# Patient Record
Sex: Male | Born: 1940 | Race: White | Hispanic: No | Marital: Single | Smoking: Never smoker
Health system: Southern US, Community
[De-identification: ages and names within clinical notes are randomized; demographics above are authoritative.]

## PROBLEM LIST (undated history)

## (undated) DIAGNOSIS — R413 Other amnesia: Secondary | ICD-10-CM

## (undated) DIAGNOSIS — I10 Essential (primary) hypertension: Secondary | ICD-10-CM

## (undated) DIAGNOSIS — R739 Hyperglycemia, unspecified: Secondary | ICD-10-CM

## (undated) DIAGNOSIS — E785 Hyperlipidemia, unspecified: Secondary | ICD-10-CM

## (undated) DIAGNOSIS — H539 Unspecified visual disturbance: Secondary | ICD-10-CM

## (undated) DIAGNOSIS — N400 Enlarged prostate without lower urinary tract symptoms: Secondary | ICD-10-CM

## (undated) DIAGNOSIS — E538 Deficiency of other specified B group vitamins: Secondary | ICD-10-CM

## (undated) DIAGNOSIS — D329 Benign neoplasm of meninges, unspecified: Secondary | ICD-10-CM

## (undated) DIAGNOSIS — R569 Unspecified convulsions: Secondary | ICD-10-CM

## (undated) DIAGNOSIS — F329 Major depressive disorder, single episode, unspecified: Secondary | ICD-10-CM

## (undated) DIAGNOSIS — Z9889 Other specified postprocedural states: Secondary | ICD-10-CM

## (undated) DIAGNOSIS — E559 Vitamin D deficiency, unspecified: Secondary | ICD-10-CM

## (undated) DIAGNOSIS — F32A Depression, unspecified: Secondary | ICD-10-CM

## (undated) DIAGNOSIS — N183 Chronic kidney disease, stage 3 unspecified: Secondary | ICD-10-CM

## (undated) DIAGNOSIS — I4891 Unspecified atrial fibrillation: Secondary | ICD-10-CM

## (undated) DIAGNOSIS — G250 Essential tremor: Secondary | ICD-10-CM

## (undated) DIAGNOSIS — R1310 Dysphagia, unspecified: Secondary | ICD-10-CM

## (undated) DIAGNOSIS — R7303 Prediabetes: Secondary | ICD-10-CM

## (undated) HISTORY — DX: Other specified postprocedural states: Z98.890

## (undated) HISTORY — DX: Essential tremor: G25.0

## (undated) HISTORY — DX: Benign prostatic hyperplasia without lower urinary tract symptoms: N40.0

## (undated) HISTORY — DX: Unspecified convulsions: R56.9

## (undated) HISTORY — DX: Unspecified visual disturbance: H53.9

## (undated) HISTORY — DX: Hyperlipidemia, unspecified: E78.5

## (undated) HISTORY — DX: Deficiency of other specified B group vitamins: E53.8

## (undated) HISTORY — DX: Benign neoplasm of meninges, unspecified: D32.9

## (undated) HISTORY — DX: Depression, unspecified: F32.A

## (undated) HISTORY — DX: Major depressive disorder, single episode, unspecified: F32.9

## (undated) HISTORY — DX: Unspecified atrial fibrillation: I48.91

## (undated) HISTORY — PX: APPENDECTOMY: SHX54

## (undated) HISTORY — DX: Essential (primary) hypertension: I10

## (undated) HISTORY — DX: Hyperglycemia, unspecified: R73.9

## (undated) HISTORY — DX: Dysphagia, unspecified: R13.10

## (undated) HISTORY — DX: Prediabetes: R73.03

## (undated) HISTORY — DX: Vitamin D deficiency, unspecified: E55.9

## (undated) HISTORY — DX: Other amnesia: R41.3

## (undated) HISTORY — PX: CRANIOTOMY: SHX93

## (undated) HISTORY — DX: Chronic kidney disease, stage 3 unspecified: N18.30

---

## 2001-10-30 ENCOUNTER — Encounter: Admission: RE | Admit: 2001-10-30 | Discharge: 2001-10-30 | Payer: Self-pay | Admitting: Oncology

## 2001-10-30 ENCOUNTER — Encounter (HOSPITAL_COMMUNITY): Payer: Self-pay | Admitting: Oncology

## 2001-10-30 ENCOUNTER — Encounter (HOSPITAL_COMMUNITY): Admission: RE | Admit: 2001-10-30 | Discharge: 2001-11-29 | Payer: Self-pay | Admitting: Oncology

## 2002-10-30 ENCOUNTER — Encounter (HOSPITAL_COMMUNITY): Payer: Self-pay | Admitting: Oncology

## 2002-10-30 ENCOUNTER — Encounter: Admission: RE | Admit: 2002-10-30 | Discharge: 2002-10-30 | Payer: Self-pay | Admitting: Oncology

## 2002-10-30 ENCOUNTER — Encounter (HOSPITAL_COMMUNITY): Admission: RE | Admit: 2002-10-30 | Discharge: 2002-11-29 | Payer: Self-pay | Admitting: Oncology

## 2003-11-05 ENCOUNTER — Encounter: Admission: RE | Admit: 2003-11-05 | Discharge: 2003-11-05 | Payer: Self-pay | Admitting: Oncology

## 2003-11-05 ENCOUNTER — Encounter (HOSPITAL_COMMUNITY): Admission: RE | Admit: 2003-11-05 | Discharge: 2003-12-05 | Payer: Self-pay | Admitting: Oncology

## 2004-07-08 ENCOUNTER — Encounter: Admission: RE | Admit: 2004-07-08 | Discharge: 2004-07-08 | Payer: Self-pay | Admitting: Oncology

## 2004-11-03 ENCOUNTER — Ambulatory Visit (HOSPITAL_COMMUNITY): Payer: Self-pay | Admitting: Oncology

## 2004-11-03 ENCOUNTER — Encounter: Admission: RE | Admit: 2004-11-03 | Discharge: 2004-11-03 | Payer: Self-pay | Admitting: Oncology

## 2005-11-01 ENCOUNTER — Encounter (HOSPITAL_COMMUNITY): Admission: RE | Admit: 2005-11-01 | Discharge: 2005-12-01 | Payer: Self-pay | Admitting: Oncology

## 2005-11-01 ENCOUNTER — Encounter: Admission: RE | Admit: 2005-11-01 | Discharge: 2005-11-01 | Payer: Self-pay | Admitting: Oncology

## 2005-11-01 ENCOUNTER — Ambulatory Visit (HOSPITAL_COMMUNITY): Payer: Self-pay | Admitting: Oncology

## 2006-10-31 ENCOUNTER — Encounter (HOSPITAL_COMMUNITY): Admission: RE | Admit: 2006-10-31 | Discharge: 2006-11-30 | Payer: Self-pay | Admitting: Oncology

## 2006-10-31 ENCOUNTER — Ambulatory Visit (HOSPITAL_COMMUNITY): Payer: Self-pay | Admitting: Oncology

## 2007-11-13 ENCOUNTER — Encounter (HOSPITAL_COMMUNITY): Admission: RE | Admit: 2007-11-13 | Discharge: 2007-12-13 | Payer: Self-pay | Admitting: Oncology

## 2007-11-13 ENCOUNTER — Ambulatory Visit (HOSPITAL_COMMUNITY): Payer: Self-pay | Admitting: Oncology

## 2009-04-15 ENCOUNTER — Encounter: Admission: RE | Admit: 2009-04-15 | Discharge: 2009-04-15 | Payer: Self-pay | Admitting: Neurology

## 2009-04-24 ENCOUNTER — Encounter: Admission: RE | Admit: 2009-04-24 | Discharge: 2009-04-24 | Payer: Self-pay | Admitting: Neurology

## 2009-04-29 ENCOUNTER — Ambulatory Visit: Payer: Self-pay | Admitting: Cardiology

## 2009-04-29 ENCOUNTER — Inpatient Hospital Stay (HOSPITAL_COMMUNITY): Admission: EM | Admit: 2009-04-29 | Discharge: 2009-05-07 | Payer: Self-pay | Admitting: Neurosurgery

## 2009-04-30 ENCOUNTER — Encounter (INDEPENDENT_AMBULATORY_CARE_PROVIDER_SITE_OTHER): Payer: Self-pay | Admitting: Ophthalmology

## 2009-05-01 ENCOUNTER — Encounter (INDEPENDENT_AMBULATORY_CARE_PROVIDER_SITE_OTHER): Payer: Self-pay | Admitting: Neurosurgery

## 2009-05-04 ENCOUNTER — Ambulatory Visit: Payer: Self-pay | Admitting: Physical Medicine & Rehabilitation

## 2009-05-07 ENCOUNTER — Inpatient Hospital Stay (HOSPITAL_COMMUNITY)
Admission: RE | Admit: 2009-05-07 | Discharge: 2009-06-05 | Payer: Self-pay | Admitting: Physical Medicine & Rehabilitation

## 2009-05-16 ENCOUNTER — Ambulatory Visit: Payer: Self-pay | Admitting: Physical Medicine & Rehabilitation

## 2009-07-20 ENCOUNTER — Encounter
Admission: RE | Admit: 2009-07-20 | Discharge: 2009-07-20 | Payer: Self-pay | Admitting: Physical Medicine & Rehabilitation

## 2009-07-20 ENCOUNTER — Ambulatory Visit: Payer: Self-pay | Admitting: Physical Medicine & Rehabilitation

## 2010-11-08 ENCOUNTER — Encounter: Payer: Self-pay | Admitting: Physical Medicine & Rehabilitation

## 2011-01-22 LAB — GLUCOSE, CAPILLARY
Glucose-Capillary: 101 mg/dL — ABNORMAL HIGH (ref 70–99)
Glucose-Capillary: 102 mg/dL — ABNORMAL HIGH (ref 70–99)
Glucose-Capillary: 105 mg/dL — ABNORMAL HIGH (ref 70–99)
Glucose-Capillary: 108 mg/dL — ABNORMAL HIGH (ref 70–99)
Glucose-Capillary: 109 mg/dL — ABNORMAL HIGH (ref 70–99)
Glucose-Capillary: 110 mg/dL — ABNORMAL HIGH (ref 70–99)
Glucose-Capillary: 110 mg/dL — ABNORMAL HIGH (ref 70–99)
Glucose-Capillary: 111 mg/dL — ABNORMAL HIGH (ref 70–99)
Glucose-Capillary: 115 mg/dL — ABNORMAL HIGH (ref 70–99)
Glucose-Capillary: 115 mg/dL — ABNORMAL HIGH (ref 70–99)
Glucose-Capillary: 117 mg/dL — ABNORMAL HIGH (ref 70–99)
Glucose-Capillary: 122 mg/dL — ABNORMAL HIGH (ref 70–99)
Glucose-Capillary: 122 mg/dL — ABNORMAL HIGH (ref 70–99)
Glucose-Capillary: 122 mg/dL — ABNORMAL HIGH (ref 70–99)
Glucose-Capillary: 129 mg/dL — ABNORMAL HIGH (ref 70–99)
Glucose-Capillary: 145 mg/dL — ABNORMAL HIGH (ref 70–99)
Glucose-Capillary: 155 mg/dL — ABNORMAL HIGH (ref 70–99)
Glucose-Capillary: 159 mg/dL — ABNORMAL HIGH (ref 70–99)
Glucose-Capillary: 160 mg/dL — ABNORMAL HIGH (ref 70–99)
Glucose-Capillary: 165 mg/dL — ABNORMAL HIGH (ref 70–99)
Glucose-Capillary: 168 mg/dL — ABNORMAL HIGH (ref 70–99)
Glucose-Capillary: 174 mg/dL — ABNORMAL HIGH (ref 70–99)
Glucose-Capillary: 183 mg/dL — ABNORMAL HIGH (ref 70–99)
Glucose-Capillary: 183 mg/dL — ABNORMAL HIGH (ref 70–99)
Glucose-Capillary: 184 mg/dL — ABNORMAL HIGH (ref 70–99)
Glucose-Capillary: 82 mg/dL (ref 70–99)
Glucose-Capillary: 87 mg/dL (ref 70–99)
Glucose-Capillary: 88 mg/dL (ref 70–99)
Glucose-Capillary: 94 mg/dL (ref 70–99)
Glucose-Capillary: 97 mg/dL (ref 70–99)
Glucose-Capillary: 99 mg/dL (ref 70–99)

## 2011-01-22 LAB — BLOOD GAS, ARTERIAL
Drawn by: 281201
O2 Content: 2 L/min
O2 Saturation: 99.2 %
pCO2 arterial: 36.6 mmHg (ref 35.0–45.0)
pH, Arterial: 7.485 — ABNORMAL HIGH (ref 7.350–7.450)
pO2, Arterial: 127 mmHg — ABNORMAL HIGH (ref 80.0–100.0)

## 2011-01-22 LAB — BASIC METABOLIC PANEL
CO2: 28 mEq/L (ref 19–32)
CO2: 29 mEq/L (ref 19–32)
Chloride: 97 mEq/L (ref 96–112)
Chloride: 99 mEq/L (ref 96–112)
Creatinine, Ser: 0.94 mg/dL (ref 0.4–1.5)
GFR calc Af Amer: >60 mL/min (ref >60)
GFR calc Af Amer: >60 mL/min (ref >60)
Glucose, Bld: 128 mg/dL — ABNORMAL HIGH (ref 70–99)
Potassium: 3.7 mEq/L (ref 3.5–5.1)
Sodium: 131 mEq/L — ABNORMAL LOW (ref 135–145)
Sodium: 133 mEq/L — ABNORMAL LOW (ref 135–145)

## 2011-01-22 LAB — URINALYSIS, MICROSCOPIC ONLY
Bilirubin Urine: NEGATIVE
Glucose, UA: NEGATIVE mg/dL
Ketones, ur: NEGATIVE mg/dL
Nitrite: NEGATIVE
Specific Gravity, Urine: 1.012 (ref 1.005–1.030)
pH: 7 (ref 5.0–8.0)

## 2011-01-22 LAB — CBC
HCT: 37.3 % — ABNORMAL LOW (ref 39.0–52.0)
Hemoglobin: 11.9 g/dL — ABNORMAL LOW (ref 13.0–17.0)
Hemoglobin: 12.9 g/dL — ABNORMAL LOW (ref 13.0–17.0)
MCHC: 34.5 g/dL (ref 30.0–36.0)
MCV: 97 fL (ref 78.0–100.0)
RBC: 3.59 MIL/uL — ABNORMAL LOW (ref 4.22–5.81)
RBC: 3.85 MIL/uL — ABNORMAL LOW (ref 4.22–5.81)
RDW: 13.8 % (ref 11.5–15.5)
WBC: 9.3 10*3/uL (ref 4.0–10.5)

## 2011-01-22 LAB — URINE CULTURE
Colony Count: >=100000
Colony Count: NO GROWTH
Culture: NO GROWTH

## 2011-01-22 LAB — URINALYSIS, ROUTINE W REFLEX MICROSCOPIC
Bilirubin Urine: NEGATIVE
Glucose, UA: NEGATIVE mg/dL
Nitrite: NEGATIVE
Nitrite: NEGATIVE
Specific Gravity, Urine: 1.028 (ref 1.005–1.030)
pH: 5.5 (ref 5.0–8.0)
pH: 7 (ref 5.0–8.0)

## 2011-01-22 LAB — URINE MICROSCOPIC-ADD ON

## 2011-01-22 LAB — DIFFERENTIAL
Basophils Absolute: 0 10*3/uL (ref 0.0–0.1)
Lymphocytes Relative: 13 % (ref 12–46)
Lymphs Abs: 1.2 10*3/uL (ref 0.7–4.0)
Monocytes Absolute: 0.5 10*3/uL (ref 0.1–1.0)
Neutro Abs: 7.6 10*3/uL (ref 1.7–7.7)

## 2011-01-22 LAB — LEVETIRACETAM LEVEL: Levetiracetam Lvl: 13.7 ug/mL

## 2011-01-23 LAB — PREALBUMIN: Prealbumin: 28.9 mg/dL (ref 18.0–45.0)

## 2011-01-23 LAB — POCT I-STAT 4, (NA,K, GLUC, HGB,HCT): Sodium: 135 mEq/L (ref 135–145)

## 2011-01-23 LAB — BASIC METABOLIC PANEL
CO2: 26 mEq/L (ref 19–32)
CO2: 28 mEq/L (ref 19–32)
CO2: 29 mEq/L (ref 19–32)
Calcium: 8 mg/dL — ABNORMAL LOW (ref 8.4–10.5)
Calcium: 8 mg/dL — ABNORMAL LOW (ref 8.4–10.5)
Calcium: 8.4 mg/dL (ref 8.4–10.5)
Chloride: 99 mEq/L (ref 96–112)
Creatinine, Ser: 0.8 mg/dL (ref 0.4–1.5)
Creatinine, Ser: 1 mg/dL (ref 0.4–1.5)
GFR calc Af Amer: >60 mL/min (ref >60)
GFR calc Af Amer: >60 mL/min (ref >60)
GFR calc Af Amer: >60 mL/min (ref >60)
GFR calc Af Amer: >60 mL/min (ref >60)
GFR calc non Af Amer: >60 mL/min (ref >60)
GFR calc non Af Amer: >60 mL/min (ref >60)
GFR calc non Af Amer: >60 mL/min (ref >60)
Glucose, Bld: 140 mg/dL — ABNORMAL HIGH (ref 70–99)
Glucose, Bld: 166 mg/dL — ABNORMAL HIGH (ref 70–99)
Glucose, Bld: 265 mg/dL — ABNORMAL HIGH (ref 70–99)
Potassium: 4.2 mEq/L (ref 3.5–5.1)
Sodium: 132 mEq/L — ABNORMAL LOW (ref 135–145)
Sodium: 134 mEq/L — ABNORMAL LOW (ref 135–145)
Sodium: 134 mEq/L — ABNORMAL LOW (ref 135–145)
Sodium: 137 mEq/L (ref 135–145)

## 2011-01-23 LAB — URINALYSIS, ROUTINE W REFLEX MICROSCOPIC
Leukocytes, UA: NEGATIVE
Protein, ur: NEGATIVE mg/dL
Specific Gravity, Urine: 1.028 (ref 1.005–1.030)
Urobilinogen, UA: 4 mg/dL — ABNORMAL HIGH (ref 0.0–1.0)

## 2011-01-23 LAB — CBC
HCT: 32.7 % — ABNORMAL LOW (ref 39.0–52.0)
HCT: 36.5 % — ABNORMAL LOW (ref 39.0–52.0)
HCT: 39.6 % (ref 39.0–52.0)
Hemoglobin: 11.3 g/dL — ABNORMAL LOW (ref 13.0–17.0)
Hemoglobin: 12.5 g/dL — ABNORMAL LOW (ref 13.0–17.0)
Hemoglobin: 12.5 g/dL — ABNORMAL LOW (ref 13.0–17.0)
Hemoglobin: 12.8 g/dL — ABNORMAL LOW (ref 13.0–17.0)
Hemoglobin: 13.5 g/dL (ref 13.0–17.0)
Hemoglobin: 13.8 g/dL (ref 13.0–17.0)
MCHC: 33.7 g/dL (ref 30.0–36.0)
MCHC: 34.1 g/dL (ref 30.0–36.0)
MCHC: 34.5 g/dL (ref 30.0–36.0)
MCHC: 34.5 g/dL (ref 30.0–36.0)
MCV: 95.8 fL (ref 78.0–100.0)
MCV: 96.6 fL (ref 78.0–100.0)
MCV: 98 fL (ref 78.0–100.0)
MCV: 98.2 fL (ref 78.0–100.0)
Platelets: 169 10*3/uL (ref 150–400)
Platelets: 236 10*3/uL (ref 150–400)
RBC: 3.33 MIL/uL — ABNORMAL LOW (ref 4.22–5.81)
RBC: 3.67 MIL/uL — ABNORMAL LOW (ref 4.22–5.81)
RBC: 3.76 MIL/uL — ABNORMAL LOW (ref 4.22–5.81)
RBC: 3.78 MIL/uL — ABNORMAL LOW (ref 4.22–5.81)
RBC: 3.83 MIL/uL — ABNORMAL LOW (ref 4.22–5.81)
RBC: 3.83 MIL/uL — ABNORMAL LOW (ref 4.22–5.81)
RBC: 4.09 MIL/uL — ABNORMAL LOW (ref 4.22–5.81)
RBC: 4.13 MIL/uL — ABNORMAL LOW (ref 4.22–5.81)
RDW: 12.9 % (ref 11.5–15.5)
RDW: 13.3 % (ref 11.5–15.5)
RDW: 13.5 % (ref 11.5–15.5)
WBC: 12.5 10*3/uL — ABNORMAL HIGH (ref 4.0–10.5)
WBC: 24.7 10*3/uL — ABNORMAL HIGH (ref 4.0–10.5)
WBC: 51.8 10*3/uL (ref 4.0–10.5)

## 2011-01-23 LAB — GLUCOSE, CAPILLARY
Glucose-Capillary: 158 mg/dL — ABNORMAL HIGH (ref 70–99)
Glucose-Capillary: 161 mg/dL — ABNORMAL HIGH (ref 70–99)
Glucose-Capillary: 167 mg/dL — ABNORMAL HIGH (ref 70–99)
Glucose-Capillary: 168 mg/dL — ABNORMAL HIGH (ref 70–99)
Glucose-Capillary: 175 mg/dL — ABNORMAL HIGH (ref 70–99)
Glucose-Capillary: 176 mg/dL — ABNORMAL HIGH (ref 70–99)
Glucose-Capillary: 195 mg/dL — ABNORMAL HIGH (ref 70–99)
Glucose-Capillary: 198 mg/dL — ABNORMAL HIGH (ref 70–99)
Glucose-Capillary: 209 mg/dL — ABNORMAL HIGH (ref 70–99)
Glucose-Capillary: 212 mg/dL — ABNORMAL HIGH (ref 70–99)
Glucose-Capillary: 223 mg/dL — ABNORMAL HIGH (ref 70–99)
Glucose-Capillary: 225 mg/dL — ABNORMAL HIGH (ref 70–99)

## 2011-01-23 LAB — DIFFERENTIAL
Basophils Absolute: 0 10*3/uL (ref 0.0–0.1)
Basophils Relative: 0 % (ref 0–1)
Eosinophils Absolute: 0 10*3/uL (ref 0.0–0.7)
Eosinophils Absolute: 0 10*3/uL (ref 0.0–0.7)
Eosinophils Relative: 0 % (ref 0–5)
Lymphs Abs: 0.4 10*3/uL — ABNORMAL LOW (ref 0.7–4.0)
Lymphs Abs: 0.8 10*3/uL (ref 0.7–4.0)
Monocytes Absolute: 0.8 10*3/uL (ref 0.1–1.0)
Monocytes Absolute: 1.1 10*3/uL — ABNORMAL HIGH (ref 0.1–1.0)
Monocytes Relative: 4 % (ref 3–12)
Neutro Abs: 19.6 10*3/uL — ABNORMAL HIGH (ref 1.7–7.7)
Neutro Abs: 34 10*3/uL — ABNORMAL HIGH (ref 1.7–7.7)
Neutrophils Relative %: 96 % — ABNORMAL HIGH (ref 43–77)

## 2011-01-23 LAB — COMPREHENSIVE METABOLIC PANEL
AST: 30 U/L (ref 0–37)
AST: 44 U/L — ABNORMAL HIGH (ref 0–37)
Albumin: 1.9 g/dL — ABNORMAL LOW (ref 3.5–5.2)
Alkaline Phosphatase: 56 U/L (ref 39–117)
BUN: 25 mg/dL — ABNORMAL HIGH (ref 6–23)
CO2: 25 mEq/L (ref 19–32)
CO2: 26 mEq/L (ref 19–32)
Calcium: 8 mg/dL — ABNORMAL LOW (ref 8.4–10.5)
Chloride: 105 mEq/L (ref 96–112)
Chloride: 99 mEq/L (ref 96–112)
Creatinine, Ser: 0.95 mg/dL (ref 0.4–1.5)
Creatinine, Ser: 1.04 mg/dL (ref 0.4–1.5)
GFR calc Af Amer: >60 mL/min (ref >60)
GFR calc Af Amer: >60 mL/min (ref >60)
GFR calc non Af Amer: >60 mL/min (ref >60)
GFR calc non Af Amer: >60 mL/min (ref >60)
Glucose, Bld: 189 mg/dL — ABNORMAL HIGH (ref 70–99)
Potassium: 4 mEq/L (ref 3.5–5.1)
Potassium: 4.4 mEq/L (ref 3.5–5.1)
Sodium: 134 mEq/L — ABNORMAL LOW (ref 135–145)
Total Bilirubin: 0.9 mg/dL (ref 0.3–1.2)
Total Bilirubin: 1 mg/dL (ref 0.3–1.2)

## 2011-01-23 LAB — CULTURE, BLOOD (SINGLE)

## 2011-01-23 LAB — URINE CULTURE

## 2011-01-23 LAB — URINE MICROSCOPIC-ADD ON

## 2011-01-23 LAB — AMMONIA: Ammonia: 23 umol/L (ref 11–35)

## 2011-01-24 LAB — CROSSMATCH
ABO/RH(D): A POS
Antibody Screen: NEGATIVE

## 2011-01-24 LAB — URINALYSIS, ROUTINE W REFLEX MICROSCOPIC
Bilirubin Urine: NEGATIVE
Ketones, ur: NEGATIVE mg/dL
Nitrite: NEGATIVE
Protein, ur: NEGATIVE mg/dL

## 2011-01-24 LAB — CBC
HCT: 43.8 % (ref 39.0–52.0)
Hemoglobin: 15 g/dL (ref 13.0–17.0)
MCHC: 34.2 g/dL (ref 30.0–36.0)
MCV: 97.8 fL (ref 78.0–100.0)
Platelets: 276 10*3/uL (ref 150–400)
Platelets: 328 10*3/uL (ref 150–400)
RDW: 13 % (ref 11.5–15.5)
WBC: 15.1 10*3/uL — ABNORMAL HIGH (ref 4.0–10.5)

## 2011-01-24 LAB — COMPREHENSIVE METABOLIC PANEL
Albumin: 3.6 g/dL (ref 3.5–5.2)
Alkaline Phosphatase: 58 U/L (ref 39–117)
BUN: 16 mg/dL (ref 6–23)
Calcium: 9.5 mg/dL (ref 8.4–10.5)
Glucose, Bld: 189 mg/dL — ABNORMAL HIGH (ref 70–99)
Potassium: 4.3 mEq/L (ref 3.5–5.1)
Sodium: 134 mEq/L — ABNORMAL LOW (ref 135–145)
Total Protein: 6.7 g/dL (ref 6.0–8.3)

## 2011-01-24 LAB — ABO/RH: ABO/RH(D): A POS

## 2011-01-24 LAB — BASIC METABOLIC PANEL
BUN: 19 mg/dL (ref 6–23)
Calcium: 8.4 mg/dL (ref 8.4–10.5)
Chloride: 106 mEq/L (ref 96–112)
Creatinine, Ser: 0.89 mg/dL (ref 0.4–1.5)
GFR calc Af Amer: >60 mL/min (ref >60)

## 2011-01-24 LAB — PROTIME-INR
INR: 1.1 (ref 0.00–1.49)
Prothrombin Time: 15 seconds (ref 11.6–15.2)

## 2011-03-01 NOTE — Procedures (Signed)
EEG NUMBER:  [id number] [phone] [id number].   The patient is a left-handed gentleman, who is described as awake and  drowsy during this procedure of a portable EEG recording.  He was not  exposed to hyperventilation nor photic stimulation.   His current medications include Inderal, Protonix, Keppra, Decadron,  Tylenol, and Phenergan.  The patient is on tube feeding.   This is a repeat EEG on a 70 year old male post surgical removal of 2  meningeal tumors on the left side.  The craniotomy encompassed the  temporal and high parietal region on the left.  The patient had seizure  activity following his surgery.  This EEG is to evaluate for ongoing  subclinical seizure activity.   A posterior dominant background rhythm is seen at 7 Hz and constitutes a  slow background.  Throughout the right hemisphere, there appears to be  EMG artifact predominantly slow is the left hemisphere at the central  parietal region, especially C3 and P3.  Phase reversal activity with  sharp wave discharges is noted over both C3 and P3 electrodes, but not  over the temporal channel.  EKG showed a slow heart rhythm of about 55  beats per minute is very irregular R to R intervals and skipped beats at  times.  Also, the information is not available.  The patient may have  atrial fibrillation or heart block.   CONCLUSION:  This is an abnormal EEG:  1. Because it is generalized slow.  2. Because there is seizure activity or irritable foci seen over C3      and P3.  The patient did not show any continuous epileptiform      discharges that would constitute clinical seizure activity or      subclinical seizure activity, but a irritable focus is definitely      noted.  These epileptiform discharges were not periodic or      rhythmic.      Melvyn Novas, M.D.  Electronically Signed     NF:AOZH  D:  05/08/2009 20:16:00  T:  05/09/2009 04:00:08  Job #:  [id number]   cc:   Ranelle Oyster, M.D.  Fax: [phone]

## 2011-03-01 NOTE — Discharge Summary (Signed)
NAME:  David Casey, David Casey NO.:  [id number]   MEDICAL RECORD NO.:  [id number]          PATIENT TYPE:  IPS   LOCATION:  4006                         FACILITY:  [facility]   PHYSICIAN:  Ranelle Oyster, M.D.DATE OF BIRTH:  [birth year]   DATE OF ADMISSION:  05/07/2009  DATE OF DISCHARGE:                               DISCHARGE SUMMARY   DISCHARGE DIAGNOSES:  1. Left frontal meningioma.  2. Seizure disorder.  3. Dysphagia - resolved.  4. Benign essential tremor.  5. Urinary tract infection with epididymitis.   This is a 70 year old white male seen by [facility] an  outpatient for bouts of altered mental status.  MRI of the brain  completed that showed a large meningioma 10 x 10 cm with shift as well  as incidental parafalcine meningioma.  Admitted July 14 and underwent  embolization of tumor July 15 per Dr. Corliss Skains followed by a left  frontal temporal craniotomy with resection of tumor July 16 per Dr.  Jeral Fruit.  Decadron protocol initiated.  Follow-up cranial CT scan July 19  with no marked interval change,  mild left-to-right midline shift.  There was residual left frontal 2 cm meningioma noted on July 19 with  seizure.  EEG positive for left frontal temporal focus seizure.  Maintained on Keppra for seizure disorder which was increased to 750 mg  every 6 hours.  Modified barium swallow July 21.  A nasogastric tube  remained in place as he was n.p.o. at that time.  Remained on Inderal  for history of essential tremor.  He was minimal assist for ambulation,  needing some encouragement to participate at times.  He was admitted for  comprehensive rehab program.   PAST MEDICAL HISTORY:  Essential benign tremor, colon cancer with  resection in 1980s.  He is a remote smoker.  No alcohol.   ALLERGIES:  None.   SOCIAL HISTORY:  Lives alone in [place], retired.  One level home with  one step to entry.  Family in area, works.  Functional history prior to  admission was independent.  Functional status upon admission - rehab  services.  He was supervision bed mobility, minimal assist transfers,  minimal assist to ambulate 80 feet,  moderate assist activities of daily  living.  Noted receptive expressive aphagia.   MEDICATIONS PRIOR TO ADMISSION:  Dexamethasone 4 mg every 6 hours and  Inderal  80 mg daily.   ALLERGIES:  None.   PHYSICAL EXAMINATION:  VITAL SIGNS:  Blood pressure 146/80, pulse 58,  temperature 97.9, respirations 18.  This was an alert male in no acute distress, positive aphasia.  Followed  one-step commands.  Craniotomy site clean and dry with sutures intact at  that time.  Left-sided tremor.  He withdraws to deep stimuli.  He was  able to move all extremities.  LUNGS:  Clear to auscultation.  CARDIAC: Regular rate and rhythm.  ABDOMEN:  Soft, nontender.  Good bowel sounds.   The patient was admitted to inpatient rehab services with therapies  initiated on a 3-hour daily basis consisting of physical therapy,  occupational therapy, speech therapy and rehabilitation nursing.  The  following issues were addressed during the patient's rehabilitation  stay:  Pertaining to Mr. Dohner's left frontal meningioma, he had  undergone embolization and resection with craniotomy July 16 per Dr.  Jeral Fruit.  He had completed his Decadron taper.  He was attending  therapies although needing encouragement sometimes to participate.  Limited safety awareness.  Initial waist belt was in place, later  discontinued and placed in a Low Boy bed for his safety.  He remained on  Keppra 750 mg every 6 hours for seizure disorder.  Noted on august 10  rapid response notified after the patient unresponsive to sternal rub,  questionable seizure.  A stat cranial CT scan was completed with follow-  up per Dr. Jeral Fruit showing no acute changes.  The patient began to  respond appropriately.  He remained on his Keppra as advised.  An EEG  was completed  that showed no seizure activity.  His therapies were  resumed.  No further issues or episodes occurred.  His diet was steadily  advanced to a regular diet.  His nasogastric tube had since been  discontinued.  He remained on Inderal 80 mg daily for a history of  essential benign tremor.  During his rehabilitation stay, noted urinary  tract infection as well as mild urinary retention, noted epididymitis  remarkable for a large tender right epididymitis with some overlying  scrotal erythema.  Dr. Vonita Moss of Urology services was consulted.  The  patient presently on Flomax as well as Urecholine for some urinary  retention.  Cipro and doxycycline had been initiated.  That was  completed on August 18.  He was still needing intermittent  catheterization at times with latest intermittent catheterization of 300  mL.  With the ongoing plan for nursing home, his Urecholine will be  discontinued.  He would remain on Flomax at the discretion of Dr.  Vonita Moss.  There was some question if he would need a TURP in the near  future by Urology services.  Pertaining to his overall attendance of  therapies and mood, attempts were made to use Ritalin to help establish  his means to attend the tasks.  This was later discontinued as he was  having quite a bit of difficulty with being uncooperative.  This did  improve greatly throughout his stay.  He was able to attend better to  therapies as his program remained scheduled.  He received weekly  collaborative interdisciplinary team conferences to discuss his  estimated length of stay, family teaching and any barriers to discharge.  He was overall minimal assist in general for his mobility,  limited  participation  as the patient was refusing at times, moderate to max  assist for nonfluent aphagia for his communication skills.  He was max,  max impaired for his safety due to his aphagia, limited insight with  high low bed in place.  He was minimum to moderate  assist overall for  his activities of daily living.  After discussing with family, it was  felt skilled nursing facility would be needed with bed becoming  available on June 05, 2009 and discharge taking place at that time.   Latest labs showed a Keppra level of 13.7 on August 10.  At that time  his Keppra dosages were adjusted.  Hemoglobin 11.9, hematocrit 35.6,  sodium 133, potassium 3.9, BUN 12, creatinine 1.05.  A follow-up urine  study was pending.   DISCHARGE MEDICATIONS AT TIME OF DICTATION:  1. Norvasc 5 mg p.o. daily.  2.  Pepcid 20 mg p.o. daily.  3. Keppra 750 mg p.o. every 6 hours.  4. Resource supplement 240 mL twice daily.  5. Inderal  80 mg daily.  6. Flomax 0.8 mg at bedtime.   DIET:  Regular diet.   The patient should follow up with Dr. Faith Rogue at the outpatient  rehab service office [phone], appointment to be made.  Dr. Jeral Fruit,  Neurosurgery [phone], please call for appointment.  Dr. Larey Dresser  at the [facility] [phone].  A Foley catheter tube had been inserted  do to his urinary retention.  He will follow-up with Urology services.  His Urecholine was discontinued at that time.      Mariam Dollar, P.A.      Ranelle Oyster, M.D.  Electronically Signed    DA/MEDQ  D:  06/04/2009  T:  06/04/2009  Job:  [id number]   cc:   Ranelle Oyster, M.D.  Dr. Wyvonne Lenz, M.D.  Maretta Bees. Vonita Moss, M.D.

## 2011-03-01 NOTE — Procedures (Signed)
EEG NUMBER:  [id number].   REQUESTING PHYSICIAN:  Hilda Lias, MD   ATTENDING PHYSICIAN:  Valetta Mole. Swords, MD   CLINICAL HISTORY:  A 70 year old man, status post resection of left  frontal meningioma, with episode of unresponsiveness today.  EEG is  performed for evaluation of possible seizure.  The patient is described  as awake and asleep.  This is a portable EEG done at bedside without  photic stimulation or hyperventilation.   DESCRIPTION:  The dominant waking in this tracing appears to be a  moderate-amplitude theta alpha rhythm of 7-8 Hz, which predominates  posteriorly, appears a little bit better on the right than on the left,  and attenuates by opening and closing.  Low amplitude fast activity,  along with fairly severe suppression of rhythms, seen across the frontal  areas bilaterally.  This does appear to be some intermittent higher  amplitude of 5-6 Hz theta slowing in the left temporal area.  No  epileptiform discharges were seen.  No architecture stage II sleep is  seen.  Photic stimulation and hyperventilation not performed.  Single  channel devoted EKG revealed sinus rhythm with occasional PAC  throughout, at rate of approximately 72 beats per minute.   CONCLUSIONS:  Abnormal study due to the presence of:  1. Mild generalized slowing of the background rhythms, findings      suggestive of diffuse widespread cerebral dysfunction and      consistent with a drowsy and/or mildly encephalopathic state.  2. Mild focal slowing in the left, which is intermittent.  3. No epileptiform discharges were seen.  In comparison to EEG done on      May 08, 2009, this EEG is somewhat improved in that epileptiform      discharges no longer seen, although there is intermittent focal      slowing in the left, and generalized slowing persists.      Michael L. Thad Ranger, M.D.  Electronically Signed     VHQ:IONG  D:  05/26/2009 21:23:41  T:  05/27/2009 29:52:84  Job #:  [id number]

## 2011-03-01 NOTE — Procedures (Signed)
EEG NUMBER:  [id number] [phone] [id number].   HISTORY:  This is a 70 year old patient with a history of meningioma and  seizure-type events.  The patient is being evaluated for the seizures.   This is a portable EEG recording.  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   MEDICATIONS:  Include Decadron, Keppra, Protonix, Inderal, Tylenol,  Apresoline, Normodyne, Ativan, and Zofran.   EEG CLASSIFICATION:  Dysrhythmia grade 3, left frontotemporal  electrographic seizures recorded.   DESCRIPTION OF RECORDING:  Background rhythm of this recording consists  of a somewhat poorly modulated background activity of around 6 Hz.  As  the record progresses, the most notable feature of the recording are  prominent left frontotemporal PLEDS.  This is a persistent feature of  the study.  Photic stimulation and hyperventilation were not performed.  At the midportion of the recording, the patient generates a left  frontotemporal spike wave seizure event of 1-2 Hz that persists for  greater than 1 minute and resolves after administration of Ativan.  Technician indicates clinical manifestations are lipid trembling.  The  background PLED activity resumes following the seizure.  EKG monitor  shows no evidence of cardiac rhythm abnormalities with a heart rate of  60.   IMPRESSION:  This is an abnormal electroencephalogram recording due to  left frontotemporal periodic lateralized epileptiform discharges and an  electrographic seizure recorded, lasting greater than 1 minute.  Seizure  was treated with Ativan.  This study suggest a left frontotemporal focus  for the electrographic seizures.      Marlan Palau, M.D.  Electronically Signed     [id number]  D:  05/04/2009 14:22:26  T:  05/05/2009 03:26:10  Job #:  [id number]

## 2011-03-01 NOTE — Consult Note (Signed)
NAME:  ORTON, CAPELL           ACCOUNT NO.:  [id number]   MEDICAL RECORD NO.:  [id number]          PATIENT TYPE:  IPS   LOCATION:  4006                         FACILITY:  [facility]   PHYSICIAN:  Maretta Bees. Vonita Moss, M.D.DATE OF BIRTH:  04-Dec-1940   DATE OF CONSULTATION:  05/25/2009  DATE OF DISCHARGE:                                 CONSULTATION   HISTORY OF PRESENT ILLNESS:  I was asked to see this 70 year old  gentleman who was hospitalized for rehabilitation following resection of  a large meningioma because that caused a shift of his brain.  This was  done by Dr. Jeral Fruit.  He has been on medication for seizures and is  undergoing rehabilitation.  He has gone into urinary retention.  He  voids a small amount on his own.  He has been on 10 mg of bethanechol  t.i.d. and two Flomax daily.  He developed a small amount of bleeding  from his in-and-out catheterizations.  He denies an urge to void.  As  best I can tell, he has never had a TUR of the prostate before.   He has been on doxycycline and Cipro for an ultrasound proven case of  right epididymitis.   PAST MEDICAL HISTORY:  Essential tremor and history of colon carcinoma.   MEDICATIONS:  Inderal, Protonix, Decadron, Keppra in addition to the  Flomax and bethanechol.   SOCIAL HISTORY:  He has quit smoking a few years ago.  He does not drink  alcohol.   FAMILY HISTORY:  Noncontributory.   REVIEW OF SYSTEMS:  He has some problems with aphasia and answering  questions.  There is apparently no problem with chest pain, shortness of  breath, diarrhea, or constipation.   PHYSICAL EXAMINATION:  GENERAL:  He is an elderly male in his bed.  His  answers to questions are of unreliable validity according to the nurse.  NECK:  Supple.  CHEST:  No respiratory distress.  HEART:  Tones regular.  ABDOMEN:  No hernias or hepatosplenomegaly.  GENITOURINARY:  Penis, urethral meatus, scrotum, testicles, and  epididymis are remarkable for a  large tender right epididymis with some  overlying scrotal erythema.  It is tender to touch.  Perineum is normal  and sphincter tone is normal.  Prostate feels small and benign.   IMPRESSION:  1. 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  2. Right epididymitis.   PLAN:  Continue all of his above antibiotics and Flomax and bethanechol  but increase his bethanechol to 25 mg t.i.d. to q.i.d.   He may end up needing a TUR of the prostate when he is stable and if his  voiding does not improve.    CC: Dr Arman Filter and Dr Huntley Dec. Vonita Moss, M.D.  Electronically Signed     LJP/MEDQ  D:  05/25/2009  T:  05/26/2009  Job:  [id number]

## 2011-03-01 NOTE — Op Note (Signed)
NAME:  David Casey, David Casey           ACCOUNT NO.:  [id number]   MEDICAL RECORD NO.:  [id number]          PATIENT TYPE:  INP   LOCATION:  3106                         FACILITY:  [facility]   PHYSICIAN:  Hilda Lias, M.D.   DATE OF BIRTH:  24-Mar-1941   DATE OF PROCEDURE:  05/01/2009  DATE OF DISCHARGE:                               OPERATIVE REPORT   PREOPERATIVE DIAGNOSES:  Left frontotemporal sphenoid wing meningioma,  incidental and small parafalcine meningioma, and shift from left to  right.   POSTOPERATIVE DIAGNOSES:  Left frontotemporal sphenoid wing meningioma,  incidental and small parafalcine meningioma, and shift from left to  right.   PROCEDURE:  Left frontotemporal craniotomy and resection of large  meningioma approximately 12 x 12 cm.   SURGEON:  Hilda Lias, MD   ASSISTANT:  Stefani Dama, MD   CLINICAL HISTORY:  David Casey is a 70 year old gentleman who was  seen by Dr. Sandria Manly because of confusion.  MRI showed that he has a large  meningioma approximately 10 x 10 cm with shift from left to the right  side.  He has a small incidental parafalcine meningioma.  Surgery was  advised.  I talked to him and his family including his daughter.  They  knew the risks with the surgery including possibility of paralysis,  unable to remove the tumor and they knew that tension was going to be  the large tumor, not for the smaller one.   PROCEDURE:  The patient was taken to the OR.  Previously, yesterday, he  had an angiogram.  Embolization of the tumor was done by the  radiologist.  After he was intubated in the OR, the head was shaved and  pins were applied to head.  Then, the left side of the head was cleaned  with DuraPrep.  Drapes were applied.  A question mark incision from the  ear down to the posterior frontal and anterior frontal was made.  Raney  clips were applied.  Hemostasis was done with bipolar.  Temporal muscle  was retracted.  Then, with the craniotome we did  4 holes.  Using the  craniotome, we connected the 4 holes doing a large temporofrontal  craniotomy.  Outer dura mater was attached to the bone.  Hemostasis of  the middle meningeal artery was achieved immediately with bipolar.  Then, we realized that after we did dissection the area, then the mostly  tumor was coming from the outer part of the sphenoid wing.  Then, using  traction and dissection, we started pulling the tumors slowly, mostly  anterior frontal first and posterior frontal and then anterior temporal  and posterior temporal.  At the end, the whole tumor was removed in toto  with attachment to the base of the sphenoid wing.  This was removed with  sharp dissection and coagulated with bipolar.  Indeed the cavity of the  tumor was quite large and hemostasis of the cortex was done with  bipolar.  At the end, we had good hemostasis.  We looked at the area  where he had the pus meningioma while it was far away and small,  and we  decided that it will be more harmful than beneficial to go after this  second small one.  Once, we achieved  hemostasis, we used a bovine dura mater since the dura matter from the  patient was compromised.  This was attached to the previous dura mater  as well as the bone with 4-0 Nurolon.  The bone flap was put into place  using 4 Star plates and the scalp was closed with Vicryl and nylon.  A  Hemovac was left in the area.           ______________________________  Hilda Lias, M.D.     EB/MEDQ  D:  05/01/2009  T:  05/02/2009  Job:  [id number]   cc:   Evie Lacks, MD

## 2011-03-01 NOTE — Consult Note (Signed)
NAME:  SUEO, CULLEN           ACCOUNT NO.:  [id number]   MEDICAL RECORD NO.:  [id number]          PATIENT TYPE:  INP   LOCATION:  3037                         FACILITY:  [facility]   PHYSICIAN:  Jesse Sans. Wall, MD, FACCDATE OF BIRTH:  10/24/1940   DATE OF CONSULTATION:  DATE OF DISCHARGE:                                 CONSULTATION   PRIMARY CARDIOLOGIST:  Dr. Valera Castle (new).   REASON FOR CONSULTATION:  Abnormal EKG.   HISTORY OF PRESENT ILLNESS:  Mr. Gallardo is a 70 year old Caucasian  male with no known history of CAD, but risk factors including remote  (quit 6 to 7 years ago), 40+ pack-year smoking history, obesity, and  sedentary lifestyle presenting with an abnormal EKG completed prior to a  planned surgery to remove 2 meningeal tumors this coming Friday, May 01, 2009.  The patient denies any recent, or history of chest pain,  shortness of breath, nausea/vomiting, fevers/chills, orthopnea, or lower  extremity edema.  However, he does report dyspnea on exertion with daily  activities over the last month.  The patient also reports decrease in  activity over the last month and associated deconditioning.  Currently,  the patient is asymptomatic at rest with BP and heart rate within normal  limits and stable.  EKG shows sinus bradycardia at 59 with T-wave  inversion in V2 through V6 as well as in 1 and aVL.  Minimal T-wave  inversion/T-wave flattening in 2 and aVF, incomplete right bundle-branch  block, normal axis.  No evidence of hypertrophy, intervals within normal  limits.   PAST MEDICAL HISTORY:  1. Gastric cancer with surgical removal in the 1980s.  2. Remote tobacco abuse disorder 40+ pack year, quitting 6-7 years      ago.  3. Obesity.  4. 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  5. Mild resting tremor in upper extremities.   SOCIAL HISTORY:  The patient lives in [place] alone.  He is a retired  Geophysical data processor.  He has a 40+ pack  year smoking history, quitting  6-7 years ago, minimal EtOH use, less than one drink per week.  Denies  illicit drug use or herbal medications, has a regular diet.  No regular  exercise.   FAMILY HISTORY:  No premature death and coronary artery disease.  No  known coronary artery disease in parents and siblings.   REVIEW OF SYSTEMS:  Please see HPI.  All other symptoms reviewed and  were negative.   CODE STATUS:  Full.   ALLERGIES:  NKDA.   MEDICATIONS:  1. Dexamethasone 4 mg  p.o. q.4. hours.  2. Propranolol 80 mg p.o. daily.   PHYSICAL EXAMINATION:  VITAL SIGNS:  Temperature 97.5 degrees  Fahrenheit.  BP 116/72, pulse 62, respiration rate 20, O2 saturation 96%  on room air, weight 109.9 kg.  GENERAL:  The patient is alert and oriented x3, in no apparent distress.  Able to speak and move easily without respiratory distress.  HEENT:  Head:  Normocephalic and atraumatic.  Pupils are equal, round,  reactive to light.  Extraocular muscles were intact.  Nares are patent  without discharge.  Dentition is good.  Oropharynx without erythema or  exudate.  NECK:  Supple without lymphadenopathy.  No thyromegaly.  HEART:  Heart rate is regular with audible S1 and S2.  No clicks, rubs,  murmurs, or gallops.  Pulses are 2+ in upper extremities bilaterally.  Absent dorsalis pedis pulses, popliteal pulses 1+ and equal bilaterally.  LUNGS:  Clear to auscultation bilaterally with decreased breath sounds  at the bases.  SKIN:  No rash, lesions, or petechiae.  ABDOMEN:  Soft, nontender, nondistended.  Normal abdominal bowel sounds.  No rebound or guarding.  No hepatosplenomegaly.  No pulsations.  The  patient is mildly obese.  EXTREMITIES:  No clubbing, cyanosis, or edema.  MUSCULOSKELETAL:  No joint deformity or effusion.  No spinal or CVA  tenderness.  NEUROLOGIC:  Cranial nerves II through XII are grossly intact.  His  strength is 5/5 in all extremities and axial groups.  Normal sensation   throughout and normal cerebellar functions.  A mild upper extremity is  noted at rest.  The patient's long-term memory seems slightly impaired  as often, must request assistance from his daughter when giving history.   RADIOLOGY:  A chest x-ray shows mild interstitial prominence, may be due  to vascular crowding, related to lower lung volumes.  Mild edema is  another consideration.  MRI of the brain completed April 24, 2009, shows  large extra-axial mass in the left temporal region.  It appears to  represent meningeal tumor, very heterogeneous.  May be an aggressive  meningioma or hemangiopericytoma.  Mild white matter edema and 1-cm  midline shift related to mass effect.  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   EKG:  Please see HPI.   LABS:  WBC elevated at 24.2, HGB 15.0, HCT 43.8, PLT count 328.  Sodium  134, potassium 4.3, chloride 100, CO2 28, BUN 16, creatinine 1.15,  glucose 189.  Urinalysis, all values within normal limits.   ASSESSMENT AND PLAN:  Mr. Petersheim is a 70 year old Caucasian male  with no known history of coronary artery disease, but risk factors  including remote 40+ pack year smoking history, obesity, and sedentary  lifestyle presenting without any symptoms, but with significant EKG  changes.  Per discussion with Dr. Daleen Squibb in the absence of hypertension,  he suspects related to intracranial pathology, and increase in pressure.  We will obtain a 2-D echocardiogram as soon as possible to rule out LVH,  hypertrophic obstructive cardiomyopathy, or regional wall motion  abnormalities.  No need to do a stress test without further objective  evidence of ischemia.   Thank you for the consult.      Jarrett Ables, PAC      Thomas C. Daleen Squibb, MD, [facility]  Electronically Signed   MS/MEDQ  D:  04/29/2009  T:  04/30/2009  Job:  [id number]

## 2011-03-01 NOTE — H&P (Signed)
NAME:  David Casey, David Casey NO.:  [id number]   MEDICAL RECORD NO.:  [id number]          PATIENT TYPE:  IPS   LOCATION:  4006                         FACILITY:  [facility]   PHYSICIAN:  Erick Colace, M.D.DATE OF BIRTH:  1941-01-20   DATE OF ADMISSION:  05/07/2009  DATE OF DISCHARGE:                              HISTORY & PHYSICAL   CHIEF COMPLAINT:  Problems swallowing.   REASON FOR ADMISSION:  Rehabilitation following resection of a large  meningioma.   HISTORY OF PRESENT ILLNESS:  A 70 year old male with history of altered  mental status who was initially evaluated by Neurology.  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 Corliss Skains followed by left frontotemporal craniotomy and  resection of tumor on May 01, 2009, per Dr. Jeral Fruit.  Decadron protocol  IV 10 mg q.6 h.  Follow up cranial CT May 04, 2009, showed no  significant interval changes but continue to showed a mild left-to-right  midline shift.  There was a residual left frontal 2 cm meningioma noted.  On May 04, 2009, there was a seizure with EEG positive for left  frontotemporal focal seizure.  Placed on Keppra for seizure prophylaxis.  This was increased to 750 mg q.6 h after the seizure.  Modified barium  swallow on May 06, 2009, showed severe dysphagia and recommended n.p.o.  with Panda tube placement.  He remains in Inderal for essential tremor,  has a plan for followup EEG on May 08, 2009.   REVIEW OF SYSTEMS:  As per history and physical form, however, limited  secondary to speech disturbance.   PAST MEDICAL HISTORY:  1. Significant for essential tremor.  2. Colon carcinoma resected in 1980s.   HABITS:  Remote tobacco, quit 6 years ago.  EtOH negative.   FAMILY HISTORY:  Negative.   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  One level home, one step to enter.   FUNCTIONAL HISTORY:  Independent prior to admission.   HOME MEDICATIONS:  1. Dexamethasone 4 mg q. 6h, started recently.  2. Inderal 80 mg p.o. daily which has been ongoing.   Last hemoglobin 12.6, last white count 18,000, last platelet 236,000.  BUN 32, creatinine 0.9, potassium 4.3.   CURRENT MEDICATIONS:  1. Inderal 80 mg via tube daily.  2. Protonix 40 mg via tube daily.  3. Decadron 10 mg via tube q.6 h.  4. Keppra 750 mg q.6 h via tube.  5. Jevity 1.2.  6. Three Scoops of Beneprotein 20 mL an hour.  We will advance by 10      mL q.4 h to a goal rate of 80 mL/h.  7. H2O+ 200 mL q.6 h.  8. He is on Tylenol for pain as well as Phenergan for nausea.   PHYSICAL EXAMINATION:  VITAL SIGNS:  Blood pressure 146/80, pulse 68,  respirations  18, and temp 97.9.  GENERAL:  Overweight male in no acute distress.  HEENT:  Eyes:  Anicteric, noninjected.  External ENT normal.  NECK:  Supple without adenopathy.  Respiratory effort is good.  LUNGS:  Clear.  HEART:  Regular rate and rhythm.  No rubs, murmurs, or extra sounds.  EXTREMITIES:  Without edema.  Good pedal pulses and radial pulses.  ABDOMEN:  Positive bowel sounds, soft, nontender to palpation.  SKIN:  Shows a healing left frontotemporal incision with sutures intact.  NEUROLOGIC:  Cranial nerves II-XII intact.  Does have right central VII.  His speech has dysarthria.  He has intact naming.  His speech is  nonfluent.  Orientation is to person.  Thinks he is in [place].  Does  not know name of hospital, refuses certain questions.  He thinks it is  1990 in terms of year.  He gets defensive when questioned on this.  Mood  and affect is inappropriate.  Decreased judgment, thinks he can go home.  Sensation normal bilateral upper and lower extremities.  Tone is normal  bilateral upper and lower extremities.  Motor  strength is 5/5 in left  deltoid, biceps, triceps, finger flexor, hip flexor, quad, TA, and  gastroc.  A 5-/5 right deltoid, biceps, triceps, finger flexor and 4/5  right hip flexor, quad, TA, and gastroc.  Finger-nose-finger is intact.  Sitting balance is poor, cannot sit unsupported long leg.   POST ADMISSION PHYSICIAN EVALUATION:  1. Functional deficits secondary to large left frontal 10 x 10      meningioma status post resection with history of pronounced      cerebral edema.  2. The patient admitted to receive collaborative, interdisciplinary      care between the physiatrist, rehab nursing staff, and therapy      team.  3. The patient's level of medical complexity and substantial therapy      needs in context of that medical necessity cannot be provided at a      lesser intensity of care.  4. The patient has experienced substantial functional loss from his      baseline.  Upon functional assessment at the time of preadmission      screening, the patient was a min assist with bed mobility, min      assist for transfers but OT was not tested.  Upon functional      assessment today, the patient min assist to supervision bed      mobility, transfers min assist 180 feet, no device for ambulation,      mod assist for upper body and lower body dressing, and mod assist      for total transfers.  Judging by the patient's diagnosis, physical      exam, and functional history, the patient has displayed ability to      make functional progress which will result in measurable gains      while in inpatient rehab.  These gains will be of substantial      practical use upon discharge to home and facilitating mobility,      self-care, and independence.  Interim changes medical status since      preadmission screening are detailed in the history of present      illness.  5. The doctors will provide 24-hour management and medical needs as      well as oversight of therapy plan/treatment and  provide guidance as      appropriate regarding interactions of the two.  6. A 24-hour rehab nursing will assist in management of skin care,      bowel or bladder, wound care, medications, and help integrate      therapy concepts, techniques, and education.  7. PT will assess and treat for lower body strengthening, range of      motion, pre gait training, gait training, transfers, equipment.      Goals are for a modified independent level with mobility using no      assist device.  8. OT will assess and treat for upper body strengthening, range of      motion, ADLs, cognitive and perceptual skills, and equipment.      Goals are for a modified independent level with upper body dressing      supervision, lower body dressing.  9. Speech and language pathology will assess and treat for severe      dysphagia, aphasia, and decreased orientation.  Goals are for safe      p.o. of a modified diet without signs of aspiration, adequate      speech intelligibility and language skills to express basic needs.      We will also further assess cognition.  10.Case management and social worker will assess and treat for his      psychosocial issues and discharge planning.  11.Team conference will be held weekly to assess the patient's      progress/goals and to determine barriers to discharge.  12.The patient has demonstrated sufficient medical stability and      exercise capacity to tolerate at least 3 hours therapy per day for      at least 5 days per week.  13.Estimated length of stay is 1 week.   Prognosis for functional improvement is good for PT and OT goals, speech  goals for dysphagia and aphasia are more guarded and may be a more  prolonged issue requiring extensive outpatient therapy.   MEDICAL PROBLEM LIST AND PLAN:  1. Meningioma.  Continue Decadron, taper as per Neurosurgery.  Suture      removal prior to discharge.  2. Dysphagia.  Continue Panda feeds, advance as per speech therapy.       Monitor for signs and symptoms of aspiration.  3. Seizure disorder.  We will monitor for signs of focal symptoms.      Continue Keppra 750 q. 6h.  4. History essential tremor.  Continue Inderal 80 mg daily.  We will      monitor for signs of low blood pressure.      Erick Colace, M.D.  Electronically Signed     AEK/MEDQ  D:  05/07/2009  T:  05/08/2009  Job:  [id number]

## 2011-03-04 IMAGING — XA IR TRANSCATH EMBOLIZATION
1 series · 11 of 24 positions shown · non-contrast
Comparison: MRI examination of 04/24/2009.

May 04, 2009 –DUPLICATE COPY for exam association in [facility]. No change from original report.
CLINICAL DATA: Intermittent confusion. Memory loss. Colon cancer.
 Large left cerebral hemispheric meningioma.

 BILATERAL CAROTID ARTERIOGRAPHY, BILATERAL VERTEBRAL ARTERY
 ANGIOGRAMS AND LEFT EXTERNAL CAROTID ARTERIOGRAM FOLLOWED BY
 SUPERSELECTIVE TRANSARTERIAL EMBOLIZATION OF ABNORMAL LEFT MIDDLE
 MENINGEAL ARTERY USING PVA PARTICLES

[Series 1: run · 11 of 395 slices shown]
[im 18/395]
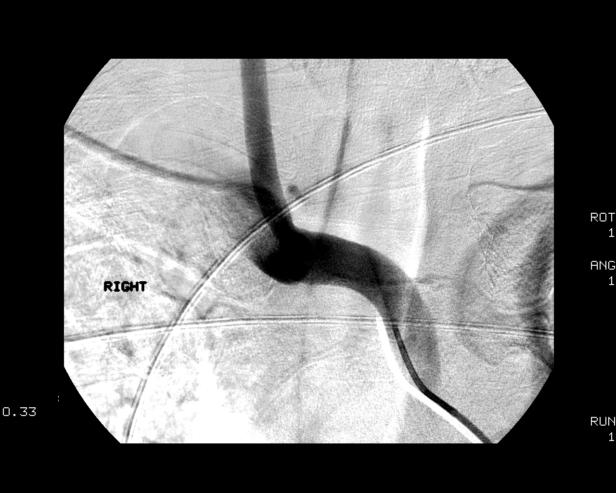
[im 52/395]
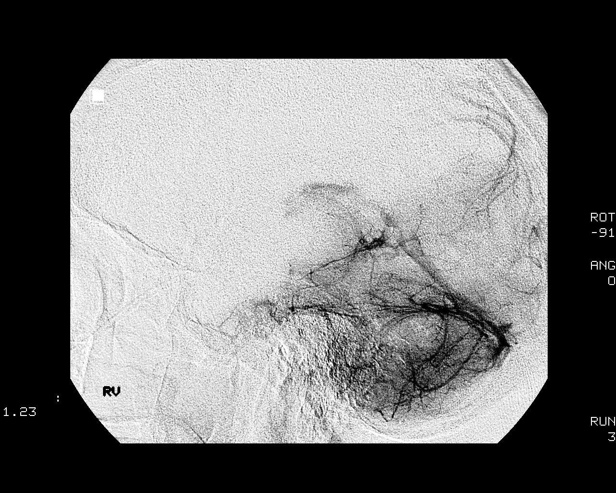
[im 86/395]
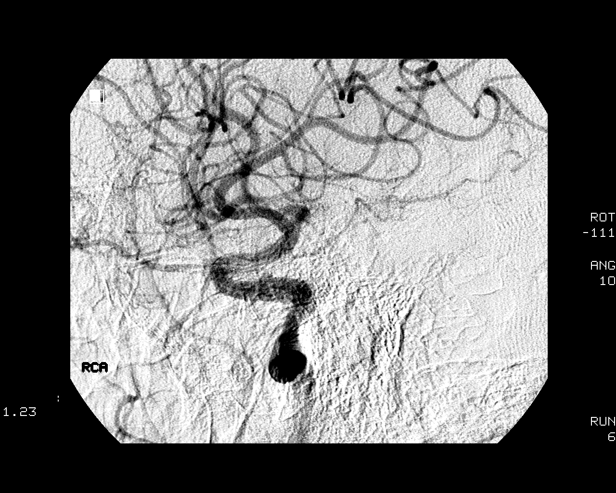
[im 120/395]
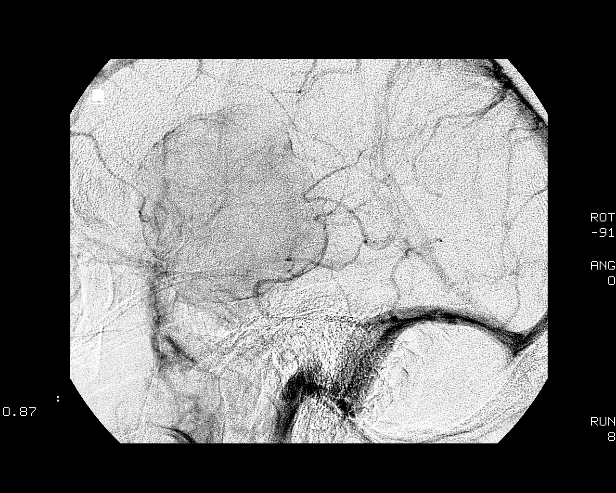
[im 155/395]
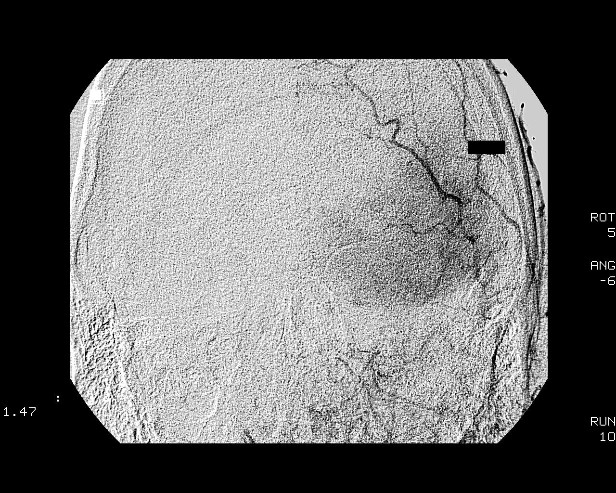
[im 206/395]
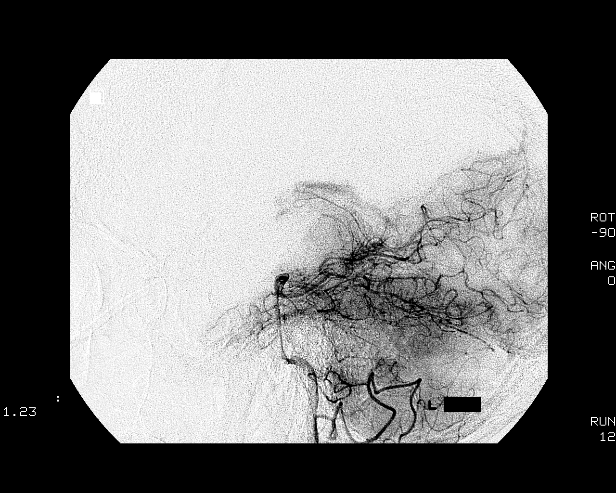
[im 240/395]
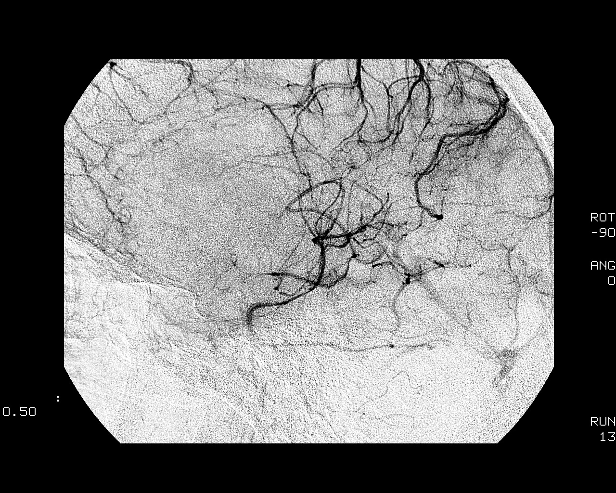
[im 275/395]
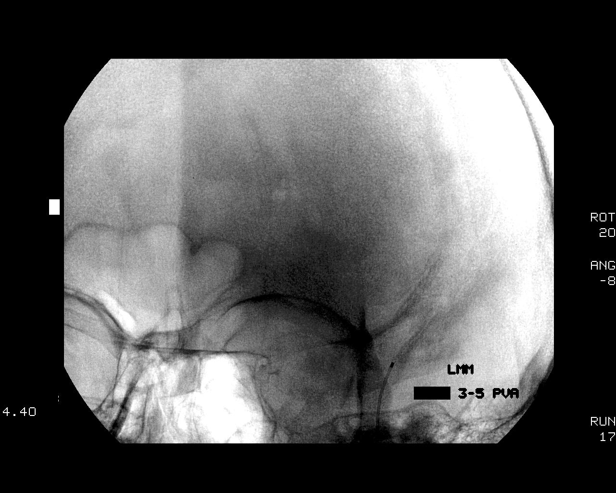
[im 309/395]
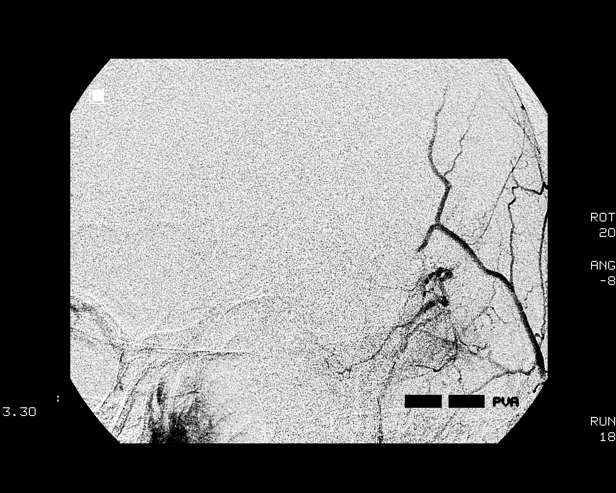
[im 343/395]
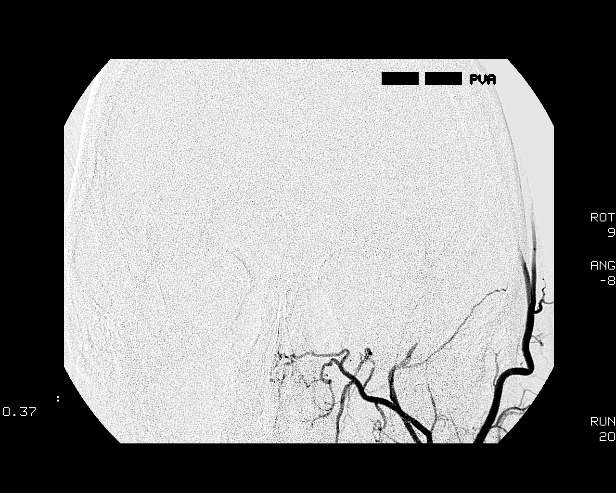
[im 377/395]
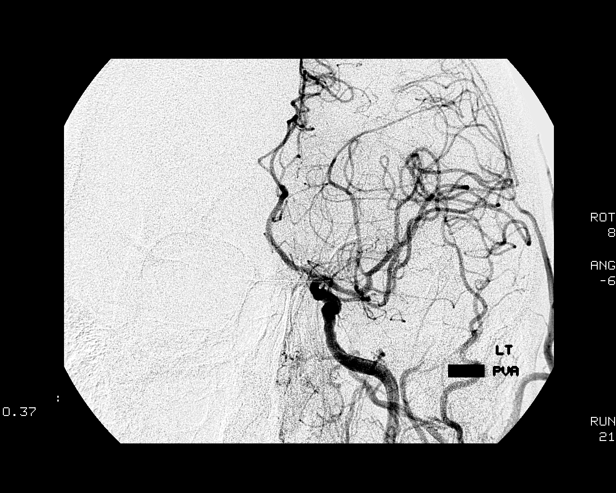

[11 of 24 positions shown; findings below may reference images not displayed]

Following a full explanation of the procedure along with the
 potential associated complications, an informed witnessed consent
 was obtained.

 The right groin was prepped and draped in the usual sterile
 fashion. Thereafter using a modified Seldinger technique,
 transfemoral access into the right common femoral artery was
 obtained without difficulty. Over a 0.035-inch guidewire, a 5-
 French JB1 catheter was advanced to the aortic arch region and
 selectively positioned in the right vertebral artery, the right
 common carotid artery, the left common carotid artery, the left
 external carotid artery and the left vertebral artery.

 There were no acute complications. The patient tolerated the
 procedure well.

 Medications utilized: Versed 1 mg IV. Fentanyl 25 mcg IV.

 Contrast: 1mnipaque-F55 approximately 65 ml.
FINDINGS: The right vertebral artery origin is normal. The vessel
 is seen to opacify normally to the cranial skull base.

 There is normal opacification of the right posterior inferior
 cerebellar artery and the right vertebrobasilar junction.

 The basilar artery, the posterior cerebral arteries, the superior
 cerebellar arteries and the anterior-inferior cerebellar arteries
 are seen to opacify normally into capillary and venous phases.
 Unopacified blood is seen in the basilar artery from the
 contralateral vertebral artery.

 The right common carotid arteriogram demonstrates the right
 external carotid artery and its major branches to be normal.

 The right internal carotid artery at the bulb has two focal areas
 of irregularity probably related to atherosclerosis. No resultant
 narrowing however is noted.

 The vessel is otherwise seen to opacify normally to the cranial
 skull base.

 The petrous segment is normal.

 There is a focal approximately 2 mm broad-based outpouching
 projecting superiorly from the proximal cavernous segment of the
 right internal carotid artery. Distal to this the vessel is seen
 to opacify normally.

 The supraclinoid segment, the right middle and the right anterior
 cerebral arteries are seen to opacify normally into the capillary
 and venous phases.

 The left common carotid arteriogram demonstrates the left external
 carotid artery and its major branches to be normally opacified.

 The left internal carotid artery at the bulb has a focal shallow
 smooth plaque.

 The vessel is otherwise seen to opacify normally to the cranial
 skull base.

 The petrous, the cavernous and the supraclinoid segments are
 normally opacified.

 The left middle cerebral artery and its branches are normally
 opacified. However there is significant mass effect on the left M1
 and the M2 and 3 branches which have displaced inferiorly and
 laterally. Also demonstrated is a mild mass effect on the left
 anterior cerebral artery at the A1 A2 junction which may be related
 to the frontal falx meningioma.

 The subsequent capillary and venous phases are normal.

 A selective left external carotid arteriogram demonstrates
 abnormally prominent anterior and posterior branches of the right
 middle meningeal artery with a corkscrew appearance. Multiple
 small vessels are seen to arise from this projecting into a large
 hypervascular mass overlying the frontal temporal region on the
 left. Arterial and venous phases demonstrate a persistent vascular
 stain in this region.

 The remaining external carotid artery branches are normally
 opacified.

 The left vertebral artery origin is normal. The vessel is seen to
 opacify normally to the cranial skull base.

 There is normal opacification of the left posterior inferior
 cerebellar artery and the left vertebrobasilar junction.

 The basilar artery, the posterior cerebral arteries, the superior
 cerebellar arteries and the anterior-inferior cerebellar arteries
 are seen to opacify normally into capillary and venous phases.
IMPRESSION: 1. Hypervascular mass in the left frontal temporal region supplied
 primarily by left middle meningeal artery branches.
 2. Significant mass effect on the left middle cerebral artery
 secondary to the frontal temporal mass, and also of the left
 anterior cerebral artery A1 A2 junction probably related to the
 anterior frontal falx meningioma.
 3. The dural venous sinuses are widely patent.

 SUPERSELECTIVE ADVANCED ARTERIAL PRESURGICAL EMBOLIZATION OF LARGE
 LEFT FRONTOTEMPORAL HYPERVASCULAR MASS PROBABLY MENINGIOMA

 The angiographic findings were reviewed with the referring
 neurosurgeon. It was decided to proceed with presurgical
 embolization of the left middle meningeal artery branches.
 Procedure was explained to the patient and also the patient's
 daughter. After informed consent the patient was put under general
 anesthesia by the [REDACTED] at [REDACTED].

 The diagnostic JB1 catheter in the left common carotid artery was
 exchanged over a 0.035-inch 300 cm Rosen exchange guidewire for a 6-
 French 65 cm neurovascular sheath using biplane roadmap technique
 and constant fluoroscopic guidance.

 Good aspiration was obtained from the side port of the
 neurovascular sheath. This was then connected to continuous
 heparinized saline infusion.

 Over the Rosen exchange guidewire, a 6-French 90 cm Brite tip Envoy
 guide catheter was advanced and positioned in the left common
 carotid artery just proximal to the bifurcation. The guidewire was
 removed. Good aspiration was obtained from the hub of the 6-French
 guide catheter. A gentle contrast injection demonstrated no
 evidence of spasms, dissections or of intraluminal filling defects.

 Using biplane roadmap technique and constant fluoroscopic guidance,
 over a 0.035-inch Roadrunner guidewire, the 6-French guide catheter
 was then advanced into the left external carotid artery distal to
 the left facial artery. The guidewire was removed. Good
 aspiration was obtained from the hub of the 6-French guide
 catheter. A gentle contrast injection demonstrated mild spasm
 which resolved with 25 mcg of nitroglycerin intra-arterially
 through the guide catheter.

 This time in a coaxial manner and with constant heparinized
 infusion using roadmap technique and constant fluoroscopic
 guidance, a Rapid transit two- tip microcatheter which had been
 steam-shaped was advanced over a 0.014-inch soft tip Transend EX
 microguidewire to the distal end of the guide catheter. The
 microguidewire had a J-tip configuration to avoid dissections or
 inducing spasm.

 Using torque device for control, and with the microguidewire
 leading, the combination was navigated without difficulty into the
 distal left middle meningeal artery just proximal to the
 bifurcation.

 The microguidewire was removed. Good aspiration was obtained from
 the hub of the Rapid transit microcatheter. A gentle contrast
 injection demonstrated safe positioning of the tip of the
 microcatheter for embolization.

 PVA particles of 250-350 micron size and 355 microns to 500 micron
 size were mixed in separate vials and mixture 75% contrast and 25%
 heparinized saline infusion.

 Suspensions were obtained of the particles. Superselective
 transarterial embolization of the above combinations of particles
 was then performed until there was complete occlusion of the A2
 branches of the middle meningeal artery. The embolization were
 performed using biplane intermittent fluoroscopy to ensure no
 reflux into normal appearing vessel was noted. Also prior to the
 start of the embolization, an arteriogram was performed through the
 microcatheter to ensure no dangerous communications with the
 ophthalmic artery and the right internal carotid artery.

 At the end of the embolization, the microcatheter was retrieved
 proximally and a control arteriogram performed through the 6-French
 guide catheter in the left external carotid artery revealed
 complete absence of the hypervascular blush that was seen prior to
 the embolization. There was stasis of contrast in the proximal
 aspect of the anterior division of the left middle meningeal
 artery, and complete obliteration of the posterior division of the
 left middle meningeal artery.

 No other external carotid artery branches were seen to be
 contributing to the hypervascular mass.

 The microcatheter was then removed. The 6-French guide catheter
 was then retrieved into the left common carotid artery and an
 arteriogram was perfor[REDACTED]ed over the head. This
 demonstrated no filling defects in the intracranial circulation.

 No acute changes were noted in the patient's blood pressure or
 neurological status.

 The 6-French guide catheter was then removed.

 The 6-French guide sheath was retrieved proximally into the
 abdominal aorta.

 The patient's ACT was reversed with 10 mg of IV protamine sulfate.
 Sheath was removed and pressure was held in the right groin with
 hemostasis being achieved. The patient was then extubated without
 difficulty. Upon recovery the patient depicted no new neurological
 signs or symptoms. The patient was then transferred to the Neuro
 ICU.

 IMPRESSION
 Status post presurgical transarterial superselective embolization
 of the left middle meningeal artery and its branches using PVA
 particles as described.

## 2011-03-08 IMAGING — CT CT HEAD W/O CM
1 series · 16 of 30 positions shown, 20 images · non-contrast
Comparison: Head CT scan 05/04/2009 and brain MRI 04/24/2009.

CLINICAL DATA: Altered mental status.  Recent craniotomy for tumor
removal.  Postoperative seizure.

CT HEAD WITHOUT CONTRAST
TECHNIQUE: Contiguous axial images were obtained from the base of
the skull through the vertex without contrast.

[Series 2: head routine 4.8 h37s · axial · 0.48mm/px · z∈[+467,+627]mm · 16 of 36 slices shown, 20 images]
[im 2/36  brain]
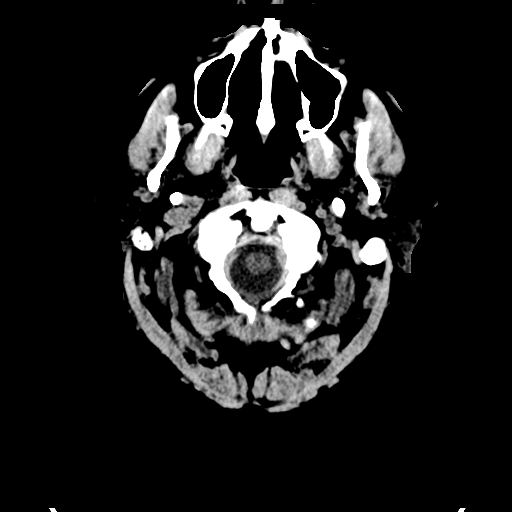
[im 2/36  bone]
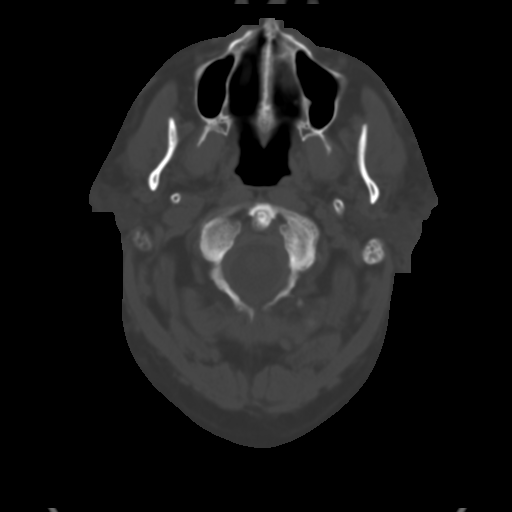
[im 4/36  brain]
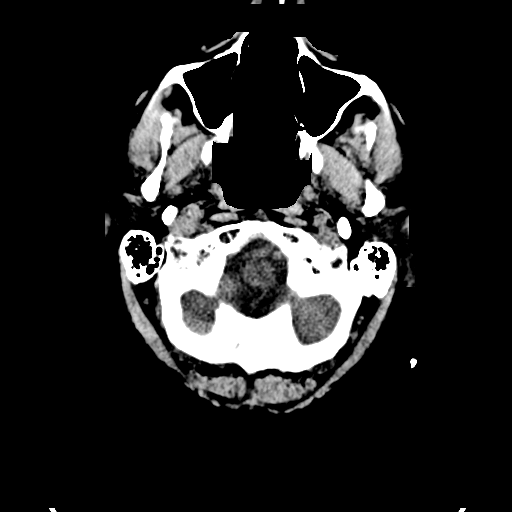
[im 7/36  brain]
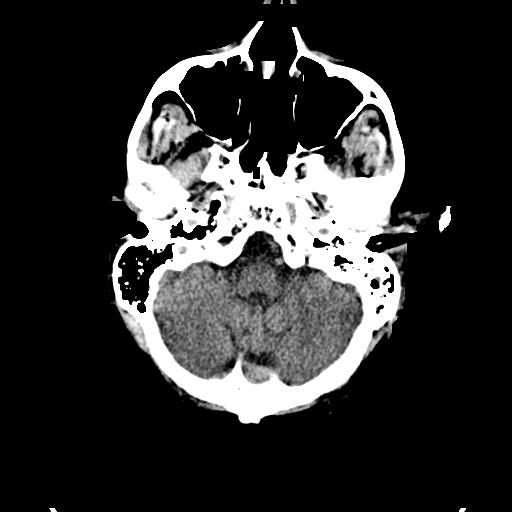
[im 9/36  brain]
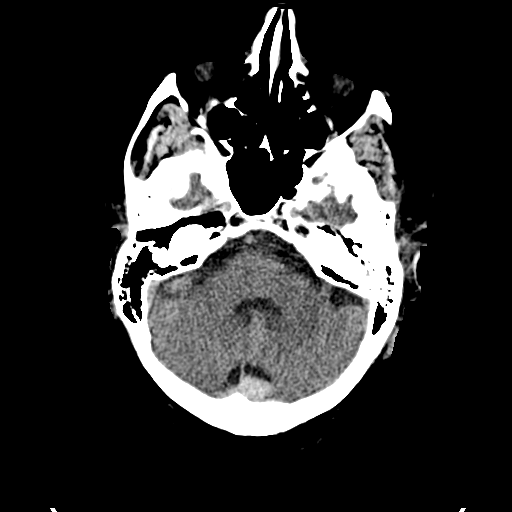
[im 10/36  brain]
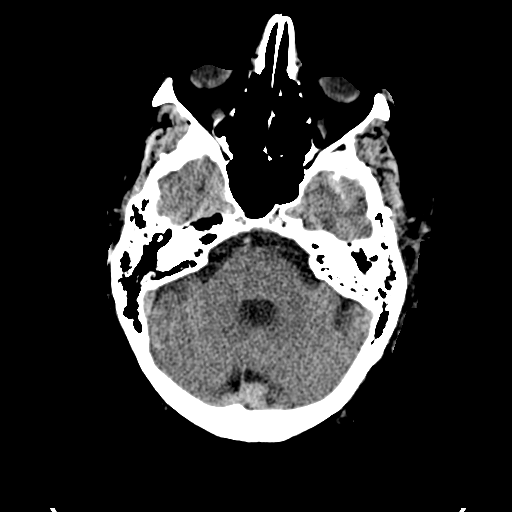
[im 10/36  bone]
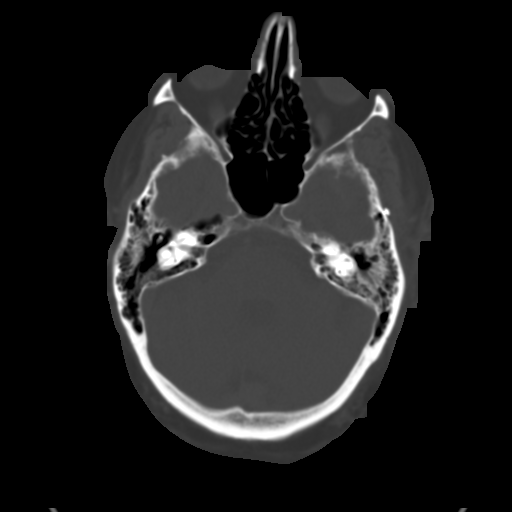
[im 13/36  brain]
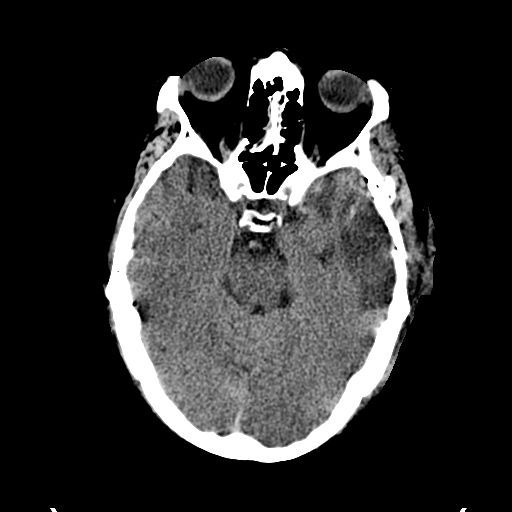
[im 15/36  brain]
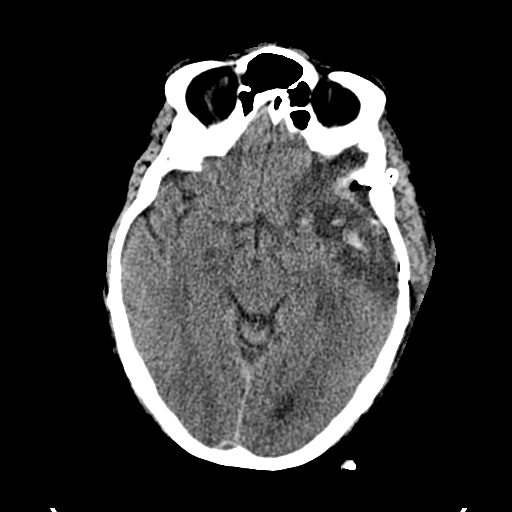
[im 17/36  brain]
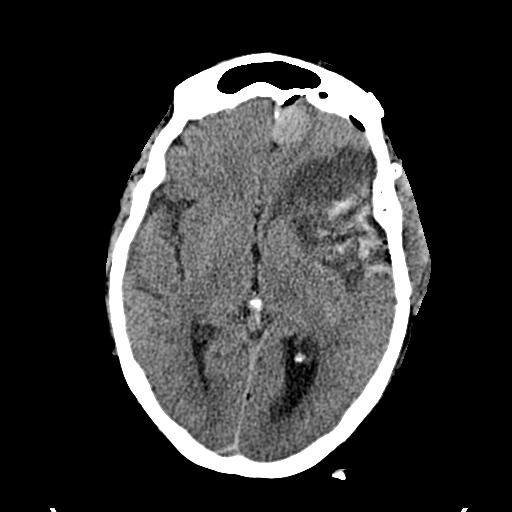
[im 19/36  brain]
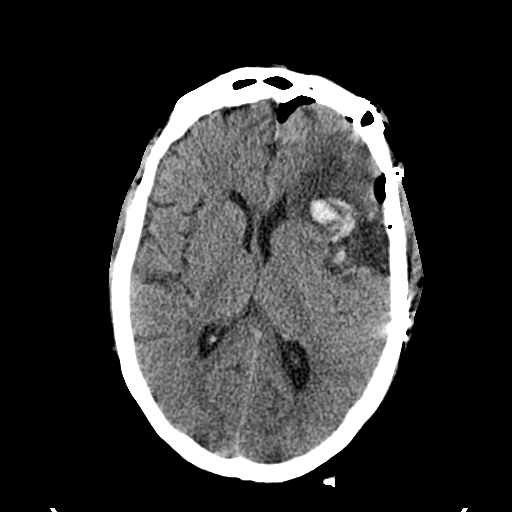
[im 19/36  bone]
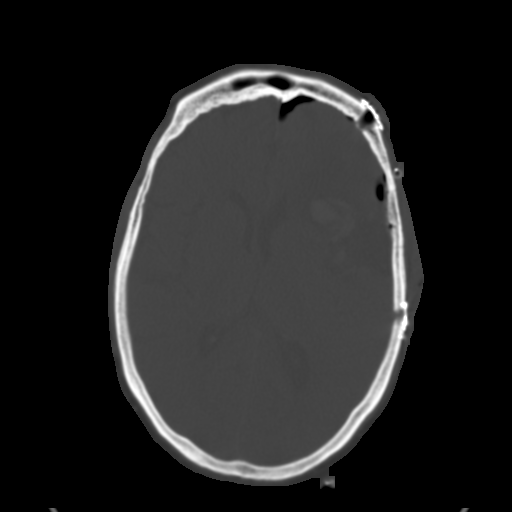
[im 21/36  brain]
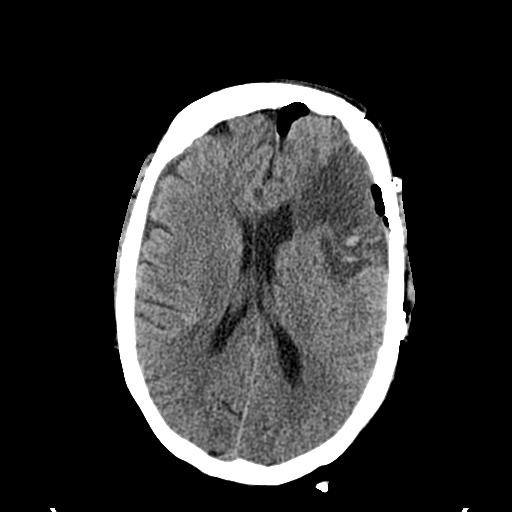
[im 23/36  brain]
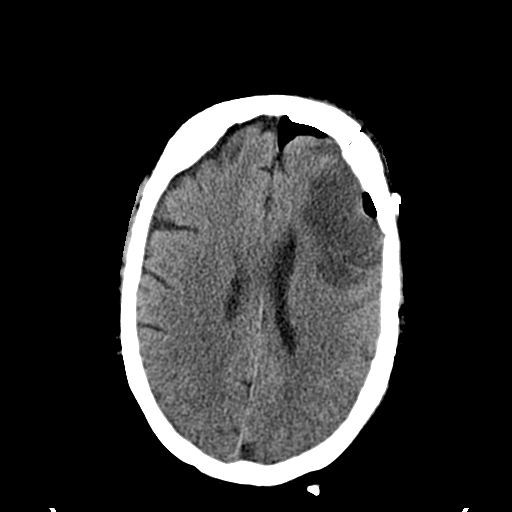
[im 26/36  brain]
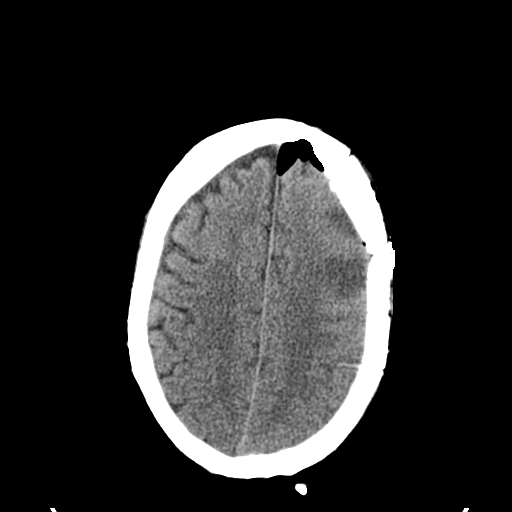
[im 27/36  brain]
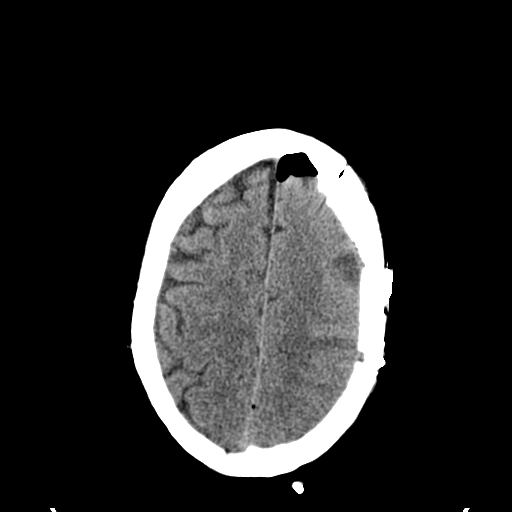
[im 27/36  bone]
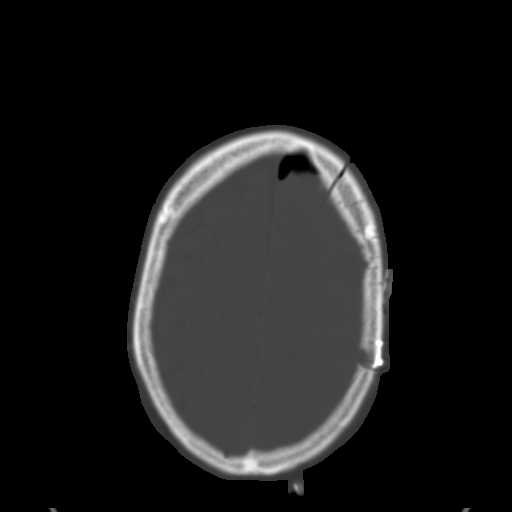
[im 29/36  brain]
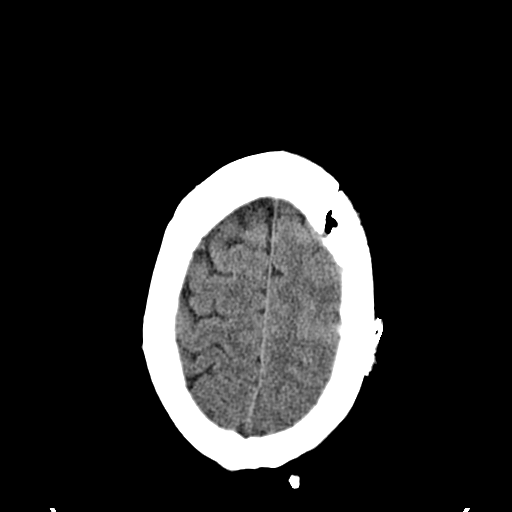
[im 32/36  brain]
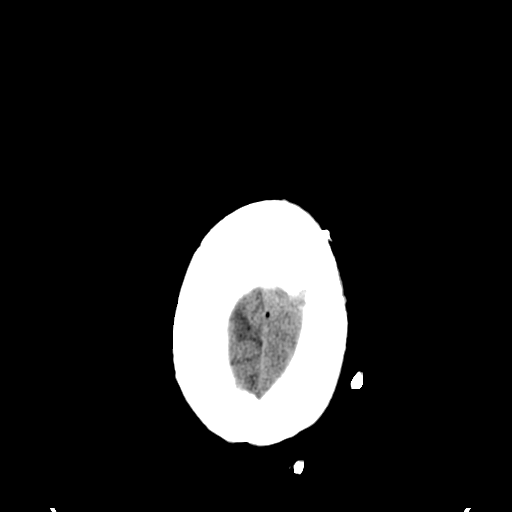
[im 34/36  brain]
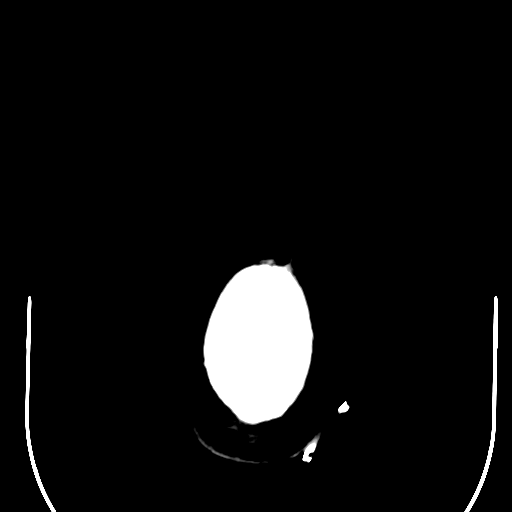

[16 of 30 positions shown; findings below may reference images not displayed]

FINDINGS: Again seen is a resection cavity in the left frontal lobe
after meningioma removal.  Second meningioma along the left side of
the falx anteriorly again noted.  As on the prior study, there is
some hemorrhage in the resection cavity with edema present.  Mild
left right midline shift of 0.4 cm is noted.  Midline shift is not
markedly changed.  There is no hydrocephalus.  No new hemorrhage.
Left-sided craniotomy defect again noted.
IMPRESSION: No marked interval change in appearance the brain with the patient
status post meningioma resection on the left.  Mild left right
midline shift again noted.

## 2011-03-14 IMAGING — CT CT ANGIO CHEST
3 of 7 series · 13 of 30 positions shown · IV contrast (omnipaque)
Comparison: Chest radiograph 05/10/2009

CLINICAL DATA: Patient with brain tumor.  Elevated D-dimer.
Evaluate for pulmonary embolism.

CT ANGIOGRAPHY CHEST WITH CONTRAST
TECHNIQUE: Multidetector CT imaging of the chest was performed
using the standard protocol during bolus administration of
intravenous contrast. Multiplanar CT image reconstructions
including MIPs were obtained to evaluate the vascular anatomy.
Contrast: 100 ml [id number]

[Series 3: pe · axial · 0.83mm/px · z∈[-267,-52]mm · 8 of 230 slices shown]
[im 29/230  lung]
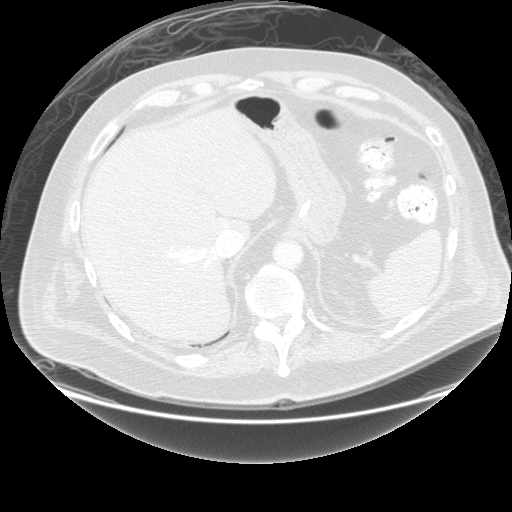
[im 58/230  mediastinal]
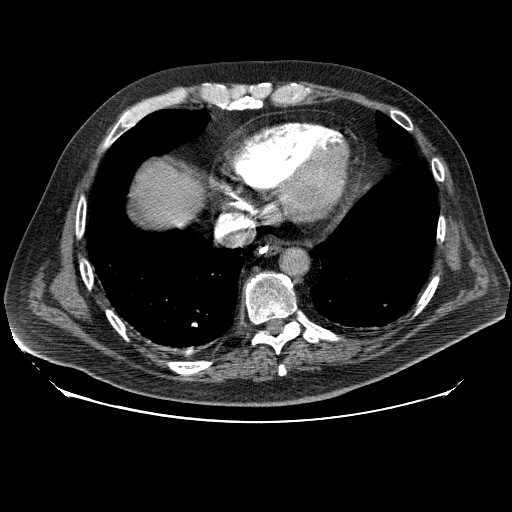
[im 86/230  lung]
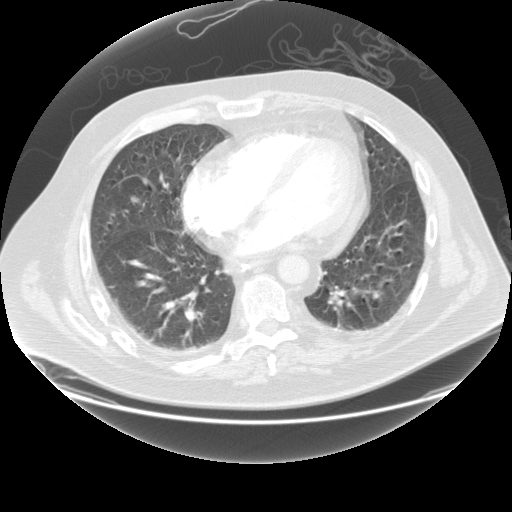
[im 115/230  mediastinal]
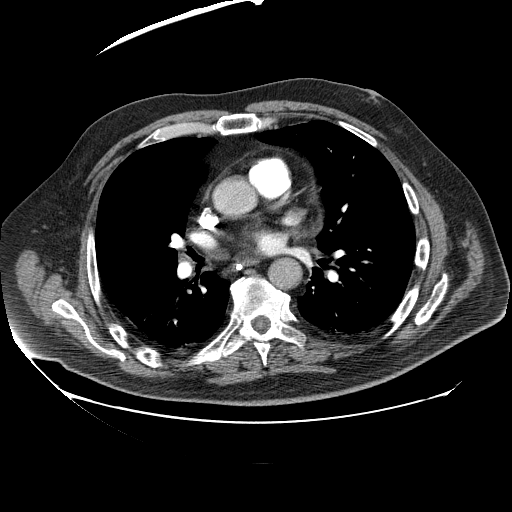
[im 136/230  lung]
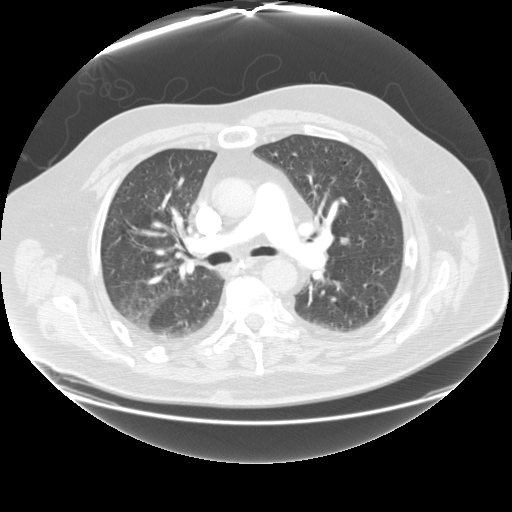
[im 144/230  mediastinal]
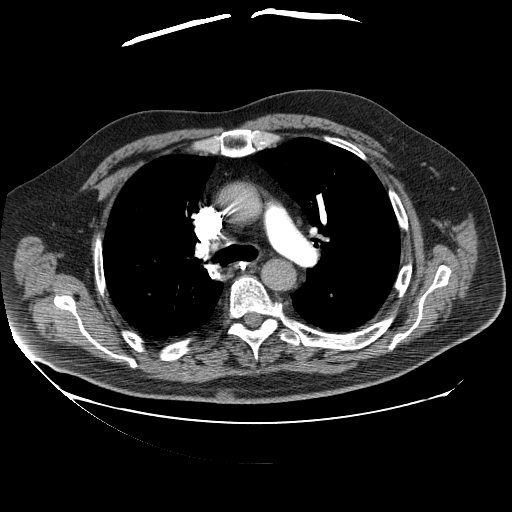
[im 172/230  lung]
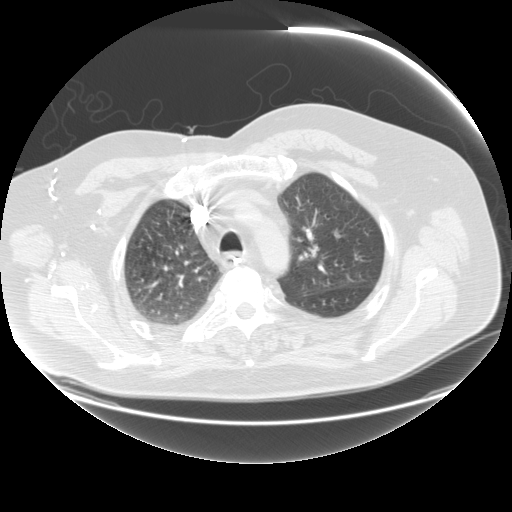
[im 201/230  mediastinal]
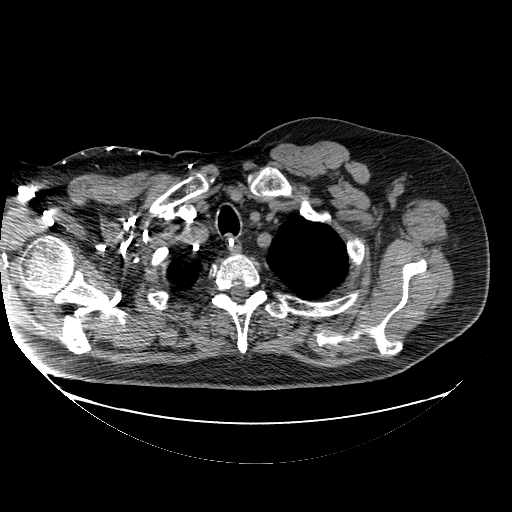

[Series 4: recon 2: pe · axial · 0.83mm/px · z∈[-206,-111]mm · 3 of 115 slices shown]
[im 39/115  lung]
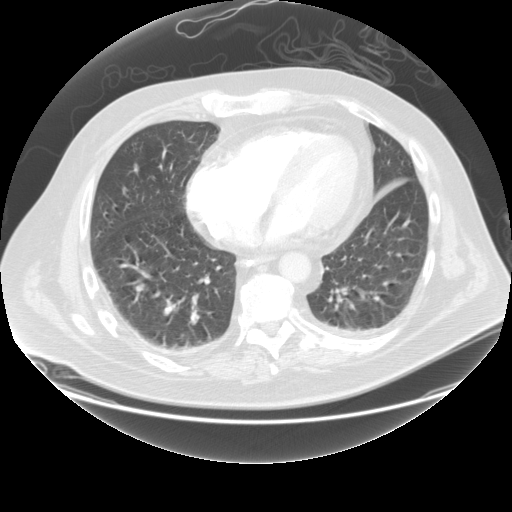
[im 68/115  lung]
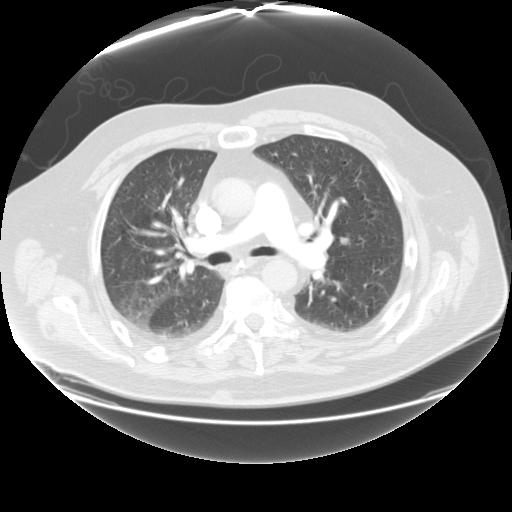
[im 77/115  lung]
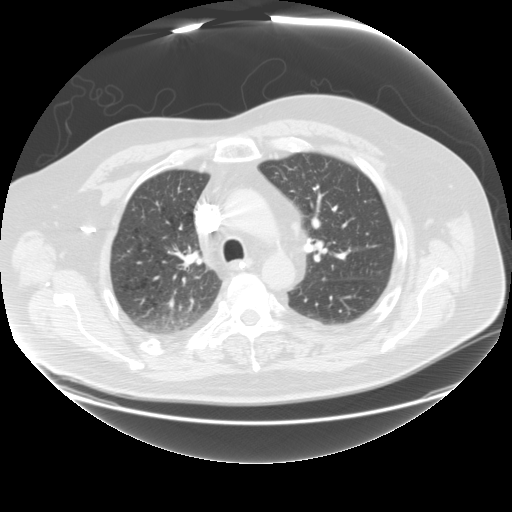

[Series 300: sag · sagittal · 0.83mm/px · 2 of 115 slices shown]
[im 39/115  lung]
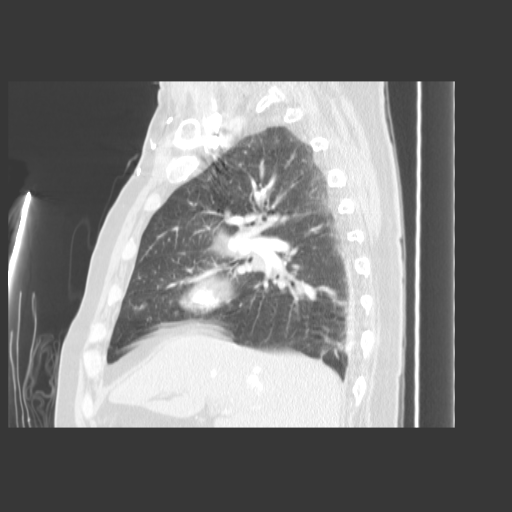
[im 77/115  lung]
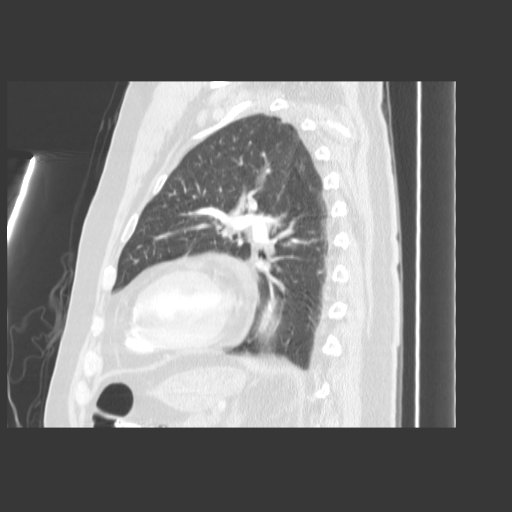

[13 of 30 positions shown; findings below may reference images not displayed]

FINDINGS: This is a technically good evaluation of the pulmonary
arterial tree.  No filling defects are identified within the
pulmonary arterial branches.  The central pulmonary arteries are
normal in caliber.

Heart size is within normal limits.  Coronary artery
atherosclerotic calcification is noted.

The thoracic aorta is normal in contour.  There is not to a
significant amount of contrast within the thoracic aorta at the
time imaging, and therefore evaluation for thoracic aortic
dissection is limited.  Thoracic aorta is normal in contour.

No axillary, mediastinal, or hilar lymphadenopathy.  No pleural or
pericardial effusion.  Thyroid gland unremarkable.

There is reflux of contrast material into the inferior vena cava
and hepatic veins.  This raises the question of  elevated right
heart pressures.

Lung windows demonstrate mild centrilobular emphysema.  There is
mild dependent atelectasis in the lower lobes bilaterally.  Very
mild ground-glass opacity in the dependent portion of the right
upper lobe posteriorly is noted, and favored to be due to
atelectasis. No pulmonary mass or discrete pulmonary nodule is
identified.

Visualized thoracic spine vertebral bodies are normal in height and
alignment.  No acute osseous abnormality or evidence of bony
metastatic disease is identified.

Within the skin and subcutaneous fat of the upper right back, just
to the right of midline is a 1.7 cm lesion.  This may be a
sebaceous cyst, or other dermal lesion.

The incidentally imaged portion of the upper abdomen demonstrates
stranding in the retroperitoneum on the left, predominately
posterior to the kidney.  There may also be a small amount of
stranding adjacent to the tail of the pancreas.

The feeding tube can be followed to the level of the pylorus of the
stomach on the CT images.  On the scout view of the abdomen,
feeding tube tip appears appropriately positioned, in the proximal
jejunum.

Review of the MIP images confirms the above findings.
IMPRESSION: 1.  Negative for pulmonary embolism.
2.  Mild centrilobular emphysema and mild dependent atelectasis. No
airspace disease or evidence of pulmonary metastatic disease is
identified.
3.  Reflux of contrast into the IVC/hepatic veins raises the
question of elevated right heart pressures.
4.  Coronary artery atherosclerotic calcifications.
5.  Stranding in the left retroperitoneum, partially imaged.
Findings are nonspecific and can be seen in asymptomatic patients,
or could be related to an acute inflammatory process related to the
urinary tract or pancreas.  Consider correlation with urinalysis
and lipase levels.

## 2011-07-07 LAB — COMPREHENSIVE METABOLIC PANEL
AST: 19
Albumin: 3.8
Alkaline Phosphatase: 66
BUN: 15
CO2: 27
Chloride: 104
GFR calc Af Amer: >60
Potassium: 3.9
Total Bilirubin: 0.5

## 2011-07-07 LAB — CBC
HCT: 45.2
Platelets: 306
RBC: 4.77
WBC: 8.7

## 2011-07-07 LAB — DIFFERENTIAL
Basophils Absolute: 0
Basophils Relative: 1
Eosinophils Relative: 2
Monocytes Absolute: 0.8

## 2014-07-24 ENCOUNTER — Encounter: Payer: Self-pay | Admitting: Neurology

## 2014-07-25 ENCOUNTER — Ambulatory Visit: Payer: 59 | Admitting: Neurology

## 2014-07-25 ENCOUNTER — Telehealth: Payer: Self-pay | Admitting: Neurology

## 2014-07-25 NOTE — Telephone Encounter (Signed)
This patient did not show for a new patient appointment today. 

## 2014-09-03 ENCOUNTER — Encounter: Payer: Self-pay | Admitting: Neurology

## 2014-09-09 ENCOUNTER — Encounter: Payer: Self-pay | Admitting: Neurology

## 2014-12-04 DIAGNOSIS — H3532 Exudative age-related macular degeneration: Secondary | ICD-10-CM | POA: Diagnosis not present

## 2014-12-25 DIAGNOSIS — H3532 Exudative age-related macular degeneration: Secondary | ICD-10-CM | POA: Diagnosis not present

## 2015-02-05 DIAGNOSIS — H3532 Exudative age-related macular degeneration: Secondary | ICD-10-CM | POA: Diagnosis not present

## 2015-03-26 DIAGNOSIS — H3532 Exudative age-related macular degeneration: Secondary | ICD-10-CM | POA: Diagnosis not present

## 2015-05-14 DIAGNOSIS — H3532 Exudative age-related macular degeneration: Secondary | ICD-10-CM | POA: Diagnosis not present

## 2015-06-25 DIAGNOSIS — H3532 Exudative age-related macular degeneration: Secondary | ICD-10-CM | POA: Diagnosis not present

## 2015-08-20 DIAGNOSIS — H353231 Exudative age-related macular degeneration, bilateral, with active choroidal neovascularization: Secondary | ICD-10-CM | POA: Diagnosis not present

## 2015-12-03 DIAGNOSIS — R1310 Dysphagia, unspecified: Secondary | ICD-10-CM | POA: Diagnosis not present

## 2015-12-03 DIAGNOSIS — K210 Gastro-esophageal reflux disease with esophagitis: Secondary | ICD-10-CM | POA: Diagnosis not present

## 2015-12-08 DIAGNOSIS — R1310 Dysphagia, unspecified: Secondary | ICD-10-CM | POA: Diagnosis not present

## 2015-12-24 ENCOUNTER — Other Ambulatory Visit: Payer: Self-pay

## 2015-12-24 DIAGNOSIS — E785 Hyperlipidemia, unspecified: Secondary | ICD-10-CM | POA: Diagnosis not present

## 2015-12-24 DIAGNOSIS — Q393 Congenital stenosis and stricture of esophagus: Secondary | ICD-10-CM | POA: Diagnosis not present

## 2015-12-24 DIAGNOSIS — Z7984 Long term (current) use of oral hypoglycemic drugs: Secondary | ICD-10-CM | POA: Diagnosis not present

## 2015-12-24 DIAGNOSIS — K222 Esophageal obstruction: Secondary | ICD-10-CM | POA: Diagnosis not present

## 2015-12-24 DIAGNOSIS — E119 Type 2 diabetes mellitus without complications: Secondary | ICD-10-CM | POA: Diagnosis not present

## 2015-12-24 DIAGNOSIS — K209 Esophagitis, unspecified: Secondary | ICD-10-CM | POA: Diagnosis not present

## 2015-12-24 DIAGNOSIS — K2970 Gastritis, unspecified, without bleeding: Secondary | ICD-10-CM | POA: Diagnosis not present

## 2015-12-24 DIAGNOSIS — K449 Diaphragmatic hernia without obstruction or gangrene: Secondary | ICD-10-CM | POA: Diagnosis not present

## 2015-12-24 DIAGNOSIS — Z79899 Other long term (current) drug therapy: Secondary | ICD-10-CM | POA: Diagnosis not present

## 2015-12-24 DIAGNOSIS — K2900 Acute gastritis without bleeding: Secondary | ICD-10-CM | POA: Diagnosis not present

## 2015-12-24 DIAGNOSIS — I10 Essential (primary) hypertension: Secondary | ICD-10-CM | POA: Diagnosis not present

## 2015-12-24 DIAGNOSIS — Z87891 Personal history of nicotine dependence: Secondary | ICD-10-CM | POA: Diagnosis not present

## 2015-12-24 DIAGNOSIS — R1310 Dysphagia, unspecified: Secondary | ICD-10-CM | POA: Diagnosis not present

## 2015-12-24 DIAGNOSIS — K210 Gastro-esophageal reflux disease with esophagitis: Secondary | ICD-10-CM | POA: Diagnosis not present

## 2015-12-24 HISTORY — PX: UPPER GI ENDOSCOPY: SHX6162

## 2015-12-31 DIAGNOSIS — H353221 Exudative age-related macular degeneration, left eye, with active choroidal neovascularization: Secondary | ICD-10-CM | POA: Diagnosis not present

## 2016-01-14 DIAGNOSIS — H353221 Exudative age-related macular degeneration, left eye, with active choroidal neovascularization: Secondary | ICD-10-CM | POA: Diagnosis not present

## 2016-02-18 DIAGNOSIS — H353221 Exudative age-related macular degeneration, left eye, with active choroidal neovascularization: Secondary | ICD-10-CM | POA: Diagnosis not present

## 2016-03-17 DIAGNOSIS — H353221 Exudative age-related macular degeneration, left eye, with active choroidal neovascularization: Secondary | ICD-10-CM | POA: Diagnosis not present

## 2016-04-21 DIAGNOSIS — M2500 Hemarthrosis, unspecified joint: Secondary | ICD-10-CM | POA: Diagnosis not present

## 2016-04-21 DIAGNOSIS — H353221 Exudative age-related macular degeneration, left eye, with active choroidal neovascularization: Secondary | ICD-10-CM | POA: Diagnosis not present

## 2016-06-02 DIAGNOSIS — H353221 Exudative age-related macular degeneration, left eye, with active choroidal neovascularization: Secondary | ICD-10-CM | POA: Diagnosis not present

## 2016-06-14 DIAGNOSIS — E538 Deficiency of other specified B group vitamins: Secondary | ICD-10-CM | POA: Diagnosis not present

## 2016-06-14 DIAGNOSIS — N400 Enlarged prostate without lower urinary tract symptoms: Secondary | ICD-10-CM | POA: Diagnosis not present

## 2016-06-14 DIAGNOSIS — I10 Essential (primary) hypertension: Secondary | ICD-10-CM | POA: Diagnosis not present

## 2016-06-14 DIAGNOSIS — R739 Hyperglycemia, unspecified: Secondary | ICD-10-CM | POA: Diagnosis not present

## 2016-06-14 DIAGNOSIS — Z1212 Encounter for screening for malignant neoplasm of rectum: Secondary | ICD-10-CM | POA: Diagnosis not present

## 2016-06-14 DIAGNOSIS — Z1389 Encounter for screening for other disorder: Secondary | ICD-10-CM | POA: Diagnosis not present

## 2016-06-14 DIAGNOSIS — Z0000 Encounter for general adult medical examination without abnormal findings: Secondary | ICD-10-CM | POA: Diagnosis not present

## 2016-06-14 DIAGNOSIS — E559 Vitamin D deficiency, unspecified: Secondary | ICD-10-CM | POA: Diagnosis not present

## 2016-06-14 DIAGNOSIS — E7800 Pure hypercholesterolemia, unspecified: Secondary | ICD-10-CM | POA: Diagnosis not present

## 2016-06-28 DIAGNOSIS — L820 Inflamed seborrheic keratosis: Secondary | ICD-10-CM | POA: Diagnosis not present

## 2016-06-30 DIAGNOSIS — H353221 Exudative age-related macular degeneration, left eye, with active choroidal neovascularization: Secondary | ICD-10-CM | POA: Diagnosis not present

## 2016-08-11 DIAGNOSIS — H353221 Exudative age-related macular degeneration, left eye, with active choroidal neovascularization: Secondary | ICD-10-CM | POA: Diagnosis not present

## 2016-09-15 DIAGNOSIS — H353221 Exudative age-related macular degeneration, left eye, with active choroidal neovascularization: Secondary | ICD-10-CM | POA: Diagnosis not present

## 2016-10-28 DIAGNOSIS — H353221 Exudative age-related macular degeneration, left eye, with active choroidal neovascularization: Secondary | ICD-10-CM | POA: Diagnosis not present

## 2016-12-15 DIAGNOSIS — H353221 Exudative age-related macular degeneration, left eye, with active choroidal neovascularization: Secondary | ICD-10-CM | POA: Diagnosis not present

## 2017-01-26 DIAGNOSIS — H353221 Exudative age-related macular degeneration, left eye, with active choroidal neovascularization: Secondary | ICD-10-CM | POA: Diagnosis not present

## 2017-03-16 DIAGNOSIS — H353221 Exudative age-related macular degeneration, left eye, with active choroidal neovascularization: Secondary | ICD-10-CM | POA: Diagnosis not present

## 2017-05-24 DIAGNOSIS — I10 Essential (primary) hypertension: Secondary | ICD-10-CM | POA: Diagnosis not present

## 2017-06-15 DIAGNOSIS — H353221 Exudative age-related macular degeneration, left eye, with active choroidal neovascularization: Secondary | ICD-10-CM | POA: Diagnosis not present

## 2017-12-11 DIAGNOSIS — J40 Bronchitis, not specified as acute or chronic: Secondary | ICD-10-CM | POA: Diagnosis not present

## 2017-12-11 DIAGNOSIS — I951 Orthostatic hypotension: Secondary | ICD-10-CM | POA: Diagnosis not present

## 2017-12-11 DIAGNOSIS — H6122 Impacted cerumen, left ear: Secondary | ICD-10-CM | POA: Diagnosis not present

## 2018-02-08 DIAGNOSIS — I10 Essential (primary) hypertension: Secondary | ICD-10-CM | POA: Diagnosis not present

## 2018-02-08 DIAGNOSIS — E7800 Pure hypercholesterolemia, unspecified: Secondary | ICD-10-CM | POA: Diagnosis not present

## 2018-02-08 DIAGNOSIS — E559 Vitamin D deficiency, unspecified: Secondary | ICD-10-CM | POA: Diagnosis not present

## 2018-02-08 DIAGNOSIS — Z0000 Encounter for general adult medical examination without abnormal findings: Secondary | ICD-10-CM | POA: Diagnosis not present

## 2018-02-08 DIAGNOSIS — N400 Enlarged prostate without lower urinary tract symptoms: Secondary | ICD-10-CM | POA: Diagnosis not present

## 2018-02-08 DIAGNOSIS — R739 Hyperglycemia, unspecified: Secondary | ICD-10-CM | POA: Diagnosis not present

## 2018-02-08 DIAGNOSIS — Z1389 Encounter for screening for other disorder: Secondary | ICD-10-CM | POA: Diagnosis not present

## 2018-05-10 DIAGNOSIS — R001 Bradycardia, unspecified: Secondary | ICD-10-CM | POA: Diagnosis not present

## 2018-05-10 DIAGNOSIS — R55 Syncope and collapse: Secondary | ICD-10-CM | POA: Diagnosis not present

## 2018-05-16 DIAGNOSIS — R001 Bradycardia, unspecified: Secondary | ICD-10-CM | POA: Diagnosis not present

## 2018-06-28 DIAGNOSIS — Z7984 Long term (current) use of oral hypoglycemic drugs: Secondary | ICD-10-CM | POA: Diagnosis not present

## 2018-06-28 DIAGNOSIS — I10 Essential (primary) hypertension: Secondary | ICD-10-CM | POA: Diagnosis not present

## 2018-06-28 DIAGNOSIS — R42 Dizziness and giddiness: Secondary | ICD-10-CM | POA: Diagnosis not present

## 2018-06-28 DIAGNOSIS — K219 Gastro-esophageal reflux disease without esophagitis: Secondary | ICD-10-CM | POA: Diagnosis not present

## 2018-06-28 DIAGNOSIS — D320 Benign neoplasm of cerebral meninges: Secondary | ICD-10-CM | POA: Diagnosis not present

## 2018-06-28 DIAGNOSIS — R61 Generalized hyperhidrosis: Secondary | ICD-10-CM | POA: Diagnosis not present

## 2018-06-28 DIAGNOSIS — I4891 Unspecified atrial fibrillation: Secondary | ICD-10-CM | POA: Diagnosis not present

## 2018-06-28 DIAGNOSIS — E785 Hyperlipidemia, unspecified: Secondary | ICD-10-CM

## 2018-06-28 DIAGNOSIS — R001 Bradycardia, unspecified: Secondary | ICD-10-CM | POA: Diagnosis not present

## 2018-06-28 DIAGNOSIS — R531 Weakness: Secondary | ICD-10-CM | POA: Diagnosis not present

## 2018-06-28 DIAGNOSIS — Z79899 Other long term (current) drug therapy: Secondary | ICD-10-CM | POA: Diagnosis not present

## 2018-06-28 DIAGNOSIS — E119 Type 2 diabetes mellitus without complications: Secondary | ICD-10-CM | POA: Diagnosis not present

## 2018-06-28 DIAGNOSIS — R748 Abnormal levels of other serum enzymes: Secondary | ICD-10-CM | POA: Diagnosis not present

## 2018-07-11 DIAGNOSIS — I4891 Unspecified atrial fibrillation: Secondary | ICD-10-CM | POA: Diagnosis not present

## 2018-08-20 DIAGNOSIS — I4891 Unspecified atrial fibrillation: Secondary | ICD-10-CM | POA: Diagnosis not present

## 2018-08-20 DIAGNOSIS — Z23 Encounter for immunization: Secondary | ICD-10-CM | POA: Diagnosis not present

## 2018-08-22 DIAGNOSIS — R55 Syncope and collapse: Secondary | ICD-10-CM | POA: Diagnosis not present

## 2018-08-22 DIAGNOSIS — I4891 Unspecified atrial fibrillation: Secondary | ICD-10-CM | POA: Diagnosis not present

## 2018-08-22 DIAGNOSIS — I10 Essential (primary) hypertension: Secondary | ICD-10-CM | POA: Diagnosis not present

## 2018-08-22 DIAGNOSIS — R9431 Abnormal electrocardiogram [ECG] [EKG]: Secondary | ICD-10-CM | POA: Diagnosis not present

## 2018-08-22 DIAGNOSIS — E7849 Other hyperlipidemia: Secondary | ICD-10-CM | POA: Diagnosis not present

## 2018-08-22 DIAGNOSIS — I480 Paroxysmal atrial fibrillation: Secondary | ICD-10-CM | POA: Diagnosis not present

## 2018-08-28 DIAGNOSIS — I4891 Unspecified atrial fibrillation: Secondary | ICD-10-CM | POA: Diagnosis not present

## 2018-09-10 DIAGNOSIS — I4891 Unspecified atrial fibrillation: Secondary | ICD-10-CM | POA: Diagnosis not present

## 2018-11-29 DIAGNOSIS — I10 Essential (primary) hypertension: Secondary | ICD-10-CM | POA: Diagnosis not present

## 2018-11-29 DIAGNOSIS — I4891 Unspecified atrial fibrillation: Secondary | ICD-10-CM | POA: Diagnosis not present

## 2019-07-15 DIAGNOSIS — H353212 Exudative age-related macular degeneration, right eye, with inactive choroidal neovascularization: Secondary | ICD-10-CM | POA: Diagnosis not present

## 2019-07-15 DIAGNOSIS — Z961 Presence of intraocular lens: Secondary | ICD-10-CM | POA: Diagnosis not present

## 2019-07-15 DIAGNOSIS — H353222 Exudative age-related macular degeneration, left eye, with inactive choroidal neovascularization: Secondary | ICD-10-CM | POA: Diagnosis not present

## 2019-07-15 DIAGNOSIS — H548 Legal blindness, as defined in USA: Secondary | ICD-10-CM | POA: Diagnosis not present

## 2019-07-29 DIAGNOSIS — I4891 Unspecified atrial fibrillation: Secondary | ICD-10-CM | POA: Diagnosis not present

## 2019-07-29 DIAGNOSIS — Z23 Encounter for immunization: Secondary | ICD-10-CM | POA: Diagnosis not present

## 2019-11-13 DIAGNOSIS — I10 Essential (primary) hypertension: Secondary | ICD-10-CM | POA: Diagnosis not present

## 2019-11-13 DIAGNOSIS — H3530 Unspecified macular degeneration: Secondary | ICD-10-CM | POA: Diagnosis not present

## 2019-11-13 DIAGNOSIS — H548 Legal blindness, as defined in USA: Secondary | ICD-10-CM | POA: Diagnosis not present

## 2019-12-07 DIAGNOSIS — R29898 Other symptoms and signs involving the musculoskeletal system: Secondary | ICD-10-CM | POA: Diagnosis not present

## 2019-12-07 DIAGNOSIS — Z743 Need for continuous supervision: Secondary | ICD-10-CM | POA: Diagnosis not present

## 2019-12-07 DIAGNOSIS — W19XXXA Unspecified fall, initial encounter: Secondary | ICD-10-CM | POA: Diagnosis not present

## 2019-12-07 DIAGNOSIS — D320 Benign neoplasm of cerebral meninges: Secondary | ICD-10-CM | POA: Diagnosis not present

## 2019-12-07 DIAGNOSIS — R404 Transient alteration of awareness: Secondary | ICD-10-CM | POA: Diagnosis not present

## 2019-12-07 DIAGNOSIS — R200 Anesthesia of skin: Secondary | ICD-10-CM | POA: Diagnosis not present

## 2019-12-07 DIAGNOSIS — R0902 Hypoxemia: Secondary | ICD-10-CM | POA: Diagnosis not present

## 2019-12-07 DIAGNOSIS — R531 Weakness: Secondary | ICD-10-CM | POA: Diagnosis not present

## 2019-12-08 DIAGNOSIS — Z8673 Personal history of transient ischemic attack (TIA), and cerebral infarction without residual deficits: Secondary | ICD-10-CM | POA: Diagnosis not present

## 2019-12-08 DIAGNOSIS — I2510 Atherosclerotic heart disease of native coronary artery without angina pectoris: Secondary | ICD-10-CM | POA: Diagnosis not present

## 2019-12-08 DIAGNOSIS — R531 Weakness: Secondary | ICD-10-CM | POA: Diagnosis not present

## 2019-12-08 DIAGNOSIS — R202 Paresthesia of skin: Secondary | ICD-10-CM | POA: Diagnosis not present

## 2019-12-08 DIAGNOSIS — I639 Cerebral infarction, unspecified: Secondary | ICD-10-CM | POA: Diagnosis not present

## 2019-12-08 DIAGNOSIS — D329 Benign neoplasm of meninges, unspecified: Secondary | ICD-10-CM | POA: Diagnosis not present

## 2019-12-08 DIAGNOSIS — I361 Nonrheumatic tricuspid (valve) insufficiency: Secondary | ICD-10-CM | POA: Diagnosis not present

## 2019-12-08 DIAGNOSIS — M4804 Spinal stenosis, thoracic region: Secondary | ICD-10-CM | POA: Diagnosis not present

## 2019-12-08 DIAGNOSIS — G8191 Hemiplegia, unspecified affecting right dominant side: Secondary | ICD-10-CM | POA: Diagnosis not present

## 2019-12-08 DIAGNOSIS — E785 Hyperlipidemia, unspecified: Secondary | ICD-10-CM | POA: Diagnosis not present

## 2019-12-08 DIAGNOSIS — R29701 NIHSS score 1: Secondary | ICD-10-CM | POA: Diagnosis not present

## 2019-12-08 DIAGNOSIS — G3184 Mild cognitive impairment, so stated: Secondary | ICD-10-CM | POA: Diagnosis not present

## 2019-12-08 DIAGNOSIS — Z7901 Long term (current) use of anticoagulants: Secondary | ICD-10-CM | POA: Diagnosis not present

## 2019-12-08 DIAGNOSIS — G8194 Hemiplegia, unspecified affecting left nondominant side: Secondary | ICD-10-CM | POA: Diagnosis not present

## 2019-12-08 DIAGNOSIS — R29700 NIHSS score 0: Secondary | ICD-10-CM | POA: Diagnosis not present

## 2019-12-08 DIAGNOSIS — D320 Benign neoplasm of cerebral meninges: Secondary | ICD-10-CM | POA: Diagnosis not present

## 2019-12-08 DIAGNOSIS — E119 Type 2 diabetes mellitus without complications: Secondary | ICD-10-CM | POA: Diagnosis not present

## 2019-12-08 DIAGNOSIS — Z79899 Other long term (current) drug therapy: Secondary | ICD-10-CM | POA: Diagnosis not present

## 2019-12-08 DIAGNOSIS — R29898 Other symptoms and signs involving the musculoskeletal system: Secondary | ICD-10-CM | POA: Diagnosis not present

## 2019-12-08 DIAGNOSIS — Z87891 Personal history of nicotine dependence: Secondary | ICD-10-CM | POA: Diagnosis not present

## 2019-12-08 DIAGNOSIS — M48061 Spinal stenosis, lumbar region without neurogenic claudication: Secondary | ICD-10-CM | POA: Diagnosis not present

## 2019-12-08 DIAGNOSIS — I4891 Unspecified atrial fibrillation: Secondary | ICD-10-CM | POA: Diagnosis not present

## 2019-12-08 DIAGNOSIS — R200 Anesthesia of skin: Secondary | ICD-10-CM | POA: Diagnosis not present

## 2019-12-08 DIAGNOSIS — I10 Essential (primary) hypertension: Secondary | ICD-10-CM | POA: Diagnosis not present

## 2019-12-08 DIAGNOSIS — I6523 Occlusion and stenosis of bilateral carotid arteries: Secondary | ICD-10-CM | POA: Diagnosis not present

## 2019-12-12 DIAGNOSIS — D329 Benign neoplasm of meninges, unspecified: Secondary | ICD-10-CM | POA: Diagnosis not present

## 2019-12-12 DIAGNOSIS — Z7984 Long term (current) use of oral hypoglycemic drugs: Secondary | ICD-10-CM | POA: Diagnosis not present

## 2019-12-12 DIAGNOSIS — I700 Atherosclerosis of aorta: Secondary | ICD-10-CM | POA: Diagnosis not present

## 2019-12-12 DIAGNOSIS — E785 Hyperlipidemia, unspecified: Secondary | ICD-10-CM | POA: Diagnosis not present

## 2019-12-12 DIAGNOSIS — Z7901 Long term (current) use of anticoagulants: Secondary | ICD-10-CM | POA: Diagnosis not present

## 2019-12-12 DIAGNOSIS — M48061 Spinal stenosis, lumbar region without neurogenic claudication: Secondary | ICD-10-CM | POA: Diagnosis not present

## 2019-12-12 DIAGNOSIS — E119 Type 2 diabetes mellitus without complications: Secondary | ICD-10-CM | POA: Diagnosis not present

## 2019-12-12 DIAGNOSIS — K219 Gastro-esophageal reflux disease without esophagitis: Secondary | ICD-10-CM | POA: Diagnosis not present

## 2019-12-12 DIAGNOSIS — Z87891 Personal history of nicotine dependence: Secondary | ICD-10-CM | POA: Diagnosis not present

## 2019-12-12 DIAGNOSIS — Z9181 History of falling: Secondary | ICD-10-CM | POA: Diagnosis not present

## 2019-12-12 DIAGNOSIS — M5126 Other intervertebral disc displacement, lumbar region: Secondary | ICD-10-CM | POA: Diagnosis not present

## 2019-12-12 DIAGNOSIS — E7800 Pure hypercholesterolemia, unspecified: Secondary | ICD-10-CM | POA: Diagnosis not present

## 2019-12-12 DIAGNOSIS — R202 Paresthesia of skin: Secondary | ICD-10-CM | POA: Diagnosis not present

## 2019-12-12 DIAGNOSIS — M47814 Spondylosis without myelopathy or radiculopathy, thoracic region: Secondary | ICD-10-CM | POA: Diagnosis not present

## 2019-12-12 DIAGNOSIS — I4891 Unspecified atrial fibrillation: Secondary | ICD-10-CM | POA: Diagnosis not present

## 2019-12-12 DIAGNOSIS — Z8673 Personal history of transient ischemic attack (TIA), and cerebral infarction without residual deficits: Secondary | ICD-10-CM | POA: Diagnosis not present

## 2019-12-12 DIAGNOSIS — Z7952 Long term (current) use of systemic steroids: Secondary | ICD-10-CM | POA: Diagnosis not present

## 2019-12-12 DIAGNOSIS — I10 Essential (primary) hypertension: Secondary | ICD-10-CM | POA: Diagnosis not present

## 2019-12-15 DIAGNOSIS — E785 Hyperlipidemia, unspecified: Secondary | ICD-10-CM | POA: Diagnosis not present

## 2019-12-15 DIAGNOSIS — I10 Essential (primary) hypertension: Secondary | ICD-10-CM | POA: Diagnosis not present

## 2019-12-15 DIAGNOSIS — M47814 Spondylosis without myelopathy or radiculopathy, thoracic region: Secondary | ICD-10-CM | POA: Diagnosis not present

## 2019-12-15 DIAGNOSIS — E119 Type 2 diabetes mellitus without complications: Secondary | ICD-10-CM | POA: Diagnosis not present

## 2019-12-15 DIAGNOSIS — Z8673 Personal history of transient ischemic attack (TIA), and cerebral infarction without residual deficits: Secondary | ICD-10-CM | POA: Diagnosis not present

## 2019-12-15 DIAGNOSIS — E7800 Pure hypercholesterolemia, unspecified: Secondary | ICD-10-CM | POA: Diagnosis not present

## 2019-12-15 DIAGNOSIS — Z9181 History of falling: Secondary | ICD-10-CM | POA: Diagnosis not present

## 2019-12-15 DIAGNOSIS — Z87891 Personal history of nicotine dependence: Secondary | ICD-10-CM | POA: Diagnosis not present

## 2019-12-15 DIAGNOSIS — I700 Atherosclerosis of aorta: Secondary | ICD-10-CM | POA: Diagnosis not present

## 2019-12-15 DIAGNOSIS — M48061 Spinal stenosis, lumbar region without neurogenic claudication: Secondary | ICD-10-CM | POA: Diagnosis not present

## 2019-12-15 DIAGNOSIS — D329 Benign neoplasm of meninges, unspecified: Secondary | ICD-10-CM | POA: Diagnosis not present

## 2019-12-15 DIAGNOSIS — I4891 Unspecified atrial fibrillation: Secondary | ICD-10-CM | POA: Diagnosis not present

## 2019-12-15 DIAGNOSIS — Z7901 Long term (current) use of anticoagulants: Secondary | ICD-10-CM | POA: Diagnosis not present

## 2019-12-15 DIAGNOSIS — R202 Paresthesia of skin: Secondary | ICD-10-CM | POA: Diagnosis not present

## 2019-12-15 DIAGNOSIS — Z7952 Long term (current) use of systemic steroids: Secondary | ICD-10-CM | POA: Diagnosis not present

## 2019-12-15 DIAGNOSIS — Z7984 Long term (current) use of oral hypoglycemic drugs: Secondary | ICD-10-CM | POA: Diagnosis not present

## 2019-12-15 DIAGNOSIS — K219 Gastro-esophageal reflux disease without esophagitis: Secondary | ICD-10-CM | POA: Diagnosis not present

## 2019-12-18 DIAGNOSIS — Z87891 Personal history of nicotine dependence: Secondary | ICD-10-CM | POA: Diagnosis not present

## 2019-12-18 DIAGNOSIS — I10 Essential (primary) hypertension: Secondary | ICD-10-CM | POA: Diagnosis not present

## 2019-12-18 DIAGNOSIS — E785 Hyperlipidemia, unspecified: Secondary | ICD-10-CM | POA: Diagnosis not present

## 2019-12-18 DIAGNOSIS — Z8673 Personal history of transient ischemic attack (TIA), and cerebral infarction without residual deficits: Secondary | ICD-10-CM | POA: Diagnosis not present

## 2019-12-18 DIAGNOSIS — Z7901 Long term (current) use of anticoagulants: Secondary | ICD-10-CM | POA: Diagnosis not present

## 2019-12-18 DIAGNOSIS — E7800 Pure hypercholesterolemia, unspecified: Secondary | ICD-10-CM | POA: Diagnosis not present

## 2019-12-18 DIAGNOSIS — D329 Benign neoplasm of meninges, unspecified: Secondary | ICD-10-CM | POA: Diagnosis not present

## 2019-12-18 DIAGNOSIS — M47814 Spondylosis without myelopathy or radiculopathy, thoracic region: Secondary | ICD-10-CM | POA: Diagnosis not present

## 2019-12-18 DIAGNOSIS — I700 Atherosclerosis of aorta: Secondary | ICD-10-CM | POA: Diagnosis not present

## 2019-12-18 DIAGNOSIS — M48061 Spinal stenosis, lumbar region without neurogenic claudication: Secondary | ICD-10-CM | POA: Diagnosis not present

## 2019-12-18 DIAGNOSIS — I4891 Unspecified atrial fibrillation: Secondary | ICD-10-CM | POA: Diagnosis not present

## 2019-12-18 DIAGNOSIS — Z7952 Long term (current) use of systemic steroids: Secondary | ICD-10-CM | POA: Diagnosis not present

## 2019-12-18 DIAGNOSIS — K219 Gastro-esophageal reflux disease without esophagitis: Secondary | ICD-10-CM | POA: Diagnosis not present

## 2019-12-18 DIAGNOSIS — E119 Type 2 diabetes mellitus without complications: Secondary | ICD-10-CM | POA: Diagnosis not present

## 2019-12-18 DIAGNOSIS — R202 Paresthesia of skin: Secondary | ICD-10-CM | POA: Diagnosis not present

## 2019-12-18 DIAGNOSIS — Z7984 Long term (current) use of oral hypoglycemic drugs: Secondary | ICD-10-CM | POA: Diagnosis not present

## 2019-12-18 DIAGNOSIS — Z9181 History of falling: Secondary | ICD-10-CM | POA: Diagnosis not present

## 2019-12-19 DIAGNOSIS — E7800 Pure hypercholesterolemia, unspecified: Secondary | ICD-10-CM | POA: Diagnosis not present

## 2019-12-19 DIAGNOSIS — Z7901 Long term (current) use of anticoagulants: Secondary | ICD-10-CM | POA: Diagnosis not present

## 2019-12-19 DIAGNOSIS — Z7952 Long term (current) use of systemic steroids: Secondary | ICD-10-CM | POA: Diagnosis not present

## 2019-12-19 DIAGNOSIS — M48061 Spinal stenosis, lumbar region without neurogenic claudication: Secondary | ICD-10-CM | POA: Diagnosis not present

## 2019-12-19 DIAGNOSIS — I700 Atherosclerosis of aorta: Secondary | ICD-10-CM | POA: Diagnosis not present

## 2019-12-19 DIAGNOSIS — I4891 Unspecified atrial fibrillation: Secondary | ICD-10-CM | POA: Diagnosis not present

## 2019-12-19 DIAGNOSIS — E785 Hyperlipidemia, unspecified: Secondary | ICD-10-CM | POA: Diagnosis not present

## 2019-12-19 DIAGNOSIS — I10 Essential (primary) hypertension: Secondary | ICD-10-CM | POA: Diagnosis not present

## 2019-12-19 DIAGNOSIS — Z7984 Long term (current) use of oral hypoglycemic drugs: Secondary | ICD-10-CM | POA: Diagnosis not present

## 2019-12-19 DIAGNOSIS — M47814 Spondylosis without myelopathy or radiculopathy, thoracic region: Secondary | ICD-10-CM | POA: Diagnosis not present

## 2019-12-19 DIAGNOSIS — D329 Benign neoplasm of meninges, unspecified: Secondary | ICD-10-CM | POA: Diagnosis not present

## 2019-12-19 DIAGNOSIS — Z9181 History of falling: Secondary | ICD-10-CM | POA: Diagnosis not present

## 2019-12-19 DIAGNOSIS — K219 Gastro-esophageal reflux disease without esophagitis: Secondary | ICD-10-CM | POA: Diagnosis not present

## 2019-12-19 DIAGNOSIS — R202 Paresthesia of skin: Secondary | ICD-10-CM | POA: Diagnosis not present

## 2019-12-19 DIAGNOSIS — Z87891 Personal history of nicotine dependence: Secondary | ICD-10-CM | POA: Diagnosis not present

## 2019-12-19 DIAGNOSIS — E119 Type 2 diabetes mellitus without complications: Secondary | ICD-10-CM | POA: Diagnosis not present

## 2019-12-19 DIAGNOSIS — Z8673 Personal history of transient ischemic attack (TIA), and cerebral infarction without residual deficits: Secondary | ICD-10-CM | POA: Diagnosis not present

## 2019-12-20 DIAGNOSIS — Z7952 Long term (current) use of systemic steroids: Secondary | ICD-10-CM | POA: Diagnosis not present

## 2019-12-20 DIAGNOSIS — I700 Atherosclerosis of aorta: Secondary | ICD-10-CM | POA: Diagnosis not present

## 2019-12-20 DIAGNOSIS — Z8673 Personal history of transient ischemic attack (TIA), and cerebral infarction without residual deficits: Secondary | ICD-10-CM | POA: Diagnosis not present

## 2019-12-20 DIAGNOSIS — E119 Type 2 diabetes mellitus without complications: Secondary | ICD-10-CM | POA: Diagnosis not present

## 2019-12-20 DIAGNOSIS — M47814 Spondylosis without myelopathy or radiculopathy, thoracic region: Secondary | ICD-10-CM | POA: Diagnosis not present

## 2019-12-20 DIAGNOSIS — E785 Hyperlipidemia, unspecified: Secondary | ICD-10-CM | POA: Diagnosis not present

## 2019-12-20 DIAGNOSIS — M48061 Spinal stenosis, lumbar region without neurogenic claudication: Secondary | ICD-10-CM | POA: Diagnosis not present

## 2019-12-20 DIAGNOSIS — Z7901 Long term (current) use of anticoagulants: Secondary | ICD-10-CM | POA: Diagnosis not present

## 2019-12-20 DIAGNOSIS — R202 Paresthesia of skin: Secondary | ICD-10-CM | POA: Diagnosis not present

## 2019-12-20 DIAGNOSIS — Z9181 History of falling: Secondary | ICD-10-CM | POA: Diagnosis not present

## 2019-12-20 DIAGNOSIS — E7800 Pure hypercholesterolemia, unspecified: Secondary | ICD-10-CM | POA: Diagnosis not present

## 2019-12-20 DIAGNOSIS — D329 Benign neoplasm of meninges, unspecified: Secondary | ICD-10-CM | POA: Diagnosis not present

## 2019-12-20 DIAGNOSIS — I10 Essential (primary) hypertension: Secondary | ICD-10-CM | POA: Diagnosis not present

## 2019-12-20 DIAGNOSIS — Z7984 Long term (current) use of oral hypoglycemic drugs: Secondary | ICD-10-CM | POA: Diagnosis not present

## 2019-12-20 DIAGNOSIS — K219 Gastro-esophageal reflux disease without esophagitis: Secondary | ICD-10-CM | POA: Diagnosis not present

## 2019-12-20 DIAGNOSIS — I4891 Unspecified atrial fibrillation: Secondary | ICD-10-CM | POA: Diagnosis not present

## 2019-12-20 DIAGNOSIS — Z87891 Personal history of nicotine dependence: Secondary | ICD-10-CM | POA: Diagnosis not present

## 2019-12-24 DIAGNOSIS — Z8673 Personal history of transient ischemic attack (TIA), and cerebral infarction without residual deficits: Secondary | ICD-10-CM | POA: Diagnosis not present

## 2019-12-24 DIAGNOSIS — E785 Hyperlipidemia, unspecified: Secondary | ICD-10-CM | POA: Diagnosis not present

## 2019-12-24 DIAGNOSIS — Z7984 Long term (current) use of oral hypoglycemic drugs: Secondary | ICD-10-CM | POA: Diagnosis not present

## 2019-12-24 DIAGNOSIS — Z7901 Long term (current) use of anticoagulants: Secondary | ICD-10-CM | POA: Diagnosis not present

## 2019-12-24 DIAGNOSIS — D329 Benign neoplasm of meninges, unspecified: Secondary | ICD-10-CM | POA: Diagnosis not present

## 2019-12-24 DIAGNOSIS — Z9181 History of falling: Secondary | ICD-10-CM | POA: Diagnosis not present

## 2019-12-24 DIAGNOSIS — Z87891 Personal history of nicotine dependence: Secondary | ICD-10-CM | POA: Diagnosis not present

## 2019-12-24 DIAGNOSIS — K219 Gastro-esophageal reflux disease without esophagitis: Secondary | ICD-10-CM | POA: Diagnosis not present

## 2019-12-24 DIAGNOSIS — E119 Type 2 diabetes mellitus without complications: Secondary | ICD-10-CM | POA: Diagnosis not present

## 2019-12-24 DIAGNOSIS — M48061 Spinal stenosis, lumbar region without neurogenic claudication: Secondary | ICD-10-CM | POA: Diagnosis not present

## 2019-12-24 DIAGNOSIS — M47814 Spondylosis without myelopathy or radiculopathy, thoracic region: Secondary | ICD-10-CM | POA: Diagnosis not present

## 2019-12-24 DIAGNOSIS — Z7952 Long term (current) use of systemic steroids: Secondary | ICD-10-CM | POA: Diagnosis not present

## 2019-12-24 DIAGNOSIS — R202 Paresthesia of skin: Secondary | ICD-10-CM | POA: Diagnosis not present

## 2019-12-24 DIAGNOSIS — I700 Atherosclerosis of aorta: Secondary | ICD-10-CM | POA: Diagnosis not present

## 2019-12-24 DIAGNOSIS — I10 Essential (primary) hypertension: Secondary | ICD-10-CM | POA: Diagnosis not present

## 2019-12-24 DIAGNOSIS — E7800 Pure hypercholesterolemia, unspecified: Secondary | ICD-10-CM | POA: Diagnosis not present

## 2019-12-24 DIAGNOSIS — I4891 Unspecified atrial fibrillation: Secondary | ICD-10-CM | POA: Diagnosis not present

## 2019-12-25 DIAGNOSIS — K219 Gastro-esophageal reflux disease without esophagitis: Secondary | ICD-10-CM | POA: Diagnosis not present

## 2019-12-25 DIAGNOSIS — E119 Type 2 diabetes mellitus without complications: Secondary | ICD-10-CM | POA: Diagnosis not present

## 2019-12-25 DIAGNOSIS — I4891 Unspecified atrial fibrillation: Secondary | ICD-10-CM | POA: Diagnosis not present

## 2019-12-25 DIAGNOSIS — Z7952 Long term (current) use of systemic steroids: Secondary | ICD-10-CM | POA: Diagnosis not present

## 2019-12-25 DIAGNOSIS — Z7901 Long term (current) use of anticoagulants: Secondary | ICD-10-CM | POA: Diagnosis not present

## 2019-12-25 DIAGNOSIS — I700 Atherosclerosis of aorta: Secondary | ICD-10-CM | POA: Diagnosis not present

## 2019-12-25 DIAGNOSIS — M47814 Spondylosis without myelopathy or radiculopathy, thoracic region: Secondary | ICD-10-CM | POA: Diagnosis not present

## 2019-12-25 DIAGNOSIS — E7800 Pure hypercholesterolemia, unspecified: Secondary | ICD-10-CM | POA: Diagnosis not present

## 2019-12-25 DIAGNOSIS — R202 Paresthesia of skin: Secondary | ICD-10-CM | POA: Diagnosis not present

## 2019-12-25 DIAGNOSIS — Z8673 Personal history of transient ischemic attack (TIA), and cerebral infarction without residual deficits: Secondary | ICD-10-CM | POA: Diagnosis not present

## 2019-12-25 DIAGNOSIS — Z9181 History of falling: Secondary | ICD-10-CM | POA: Diagnosis not present

## 2019-12-25 DIAGNOSIS — E785 Hyperlipidemia, unspecified: Secondary | ICD-10-CM | POA: Diagnosis not present

## 2019-12-25 DIAGNOSIS — D329 Benign neoplasm of meninges, unspecified: Secondary | ICD-10-CM | POA: Diagnosis not present

## 2019-12-25 DIAGNOSIS — M48061 Spinal stenosis, lumbar region without neurogenic claudication: Secondary | ICD-10-CM | POA: Diagnosis not present

## 2019-12-25 DIAGNOSIS — I10 Essential (primary) hypertension: Secondary | ICD-10-CM | POA: Diagnosis not present

## 2019-12-25 DIAGNOSIS — Z7984 Long term (current) use of oral hypoglycemic drugs: Secondary | ICD-10-CM | POA: Diagnosis not present

## 2019-12-25 DIAGNOSIS — Z87891 Personal history of nicotine dependence: Secondary | ICD-10-CM | POA: Diagnosis not present

## 2019-12-26 DIAGNOSIS — M5126 Other intervertebral disc displacement, lumbar region: Secondary | ICD-10-CM | POA: Diagnosis not present

## 2020-01-02 DIAGNOSIS — M47814 Spondylosis without myelopathy or radiculopathy, thoracic region: Secondary | ICD-10-CM | POA: Diagnosis not present

## 2020-01-02 DIAGNOSIS — M48061 Spinal stenosis, lumbar region without neurogenic claudication: Secondary | ICD-10-CM | POA: Diagnosis not present

## 2020-01-02 DIAGNOSIS — R202 Paresthesia of skin: Secondary | ICD-10-CM | POA: Diagnosis not present

## 2020-01-02 DIAGNOSIS — K219 Gastro-esophageal reflux disease without esophagitis: Secondary | ICD-10-CM | POA: Diagnosis not present

## 2020-01-02 DIAGNOSIS — D329 Benign neoplasm of meninges, unspecified: Secondary | ICD-10-CM | POA: Diagnosis not present

## 2020-01-02 DIAGNOSIS — I700 Atherosclerosis of aorta: Secondary | ICD-10-CM | POA: Diagnosis not present

## 2020-01-02 DIAGNOSIS — Z9181 History of falling: Secondary | ICD-10-CM | POA: Diagnosis not present

## 2020-01-02 DIAGNOSIS — E7800 Pure hypercholesterolemia, unspecified: Secondary | ICD-10-CM | POA: Diagnosis not present

## 2020-01-02 DIAGNOSIS — Z7952 Long term (current) use of systemic steroids: Secondary | ICD-10-CM | POA: Diagnosis not present

## 2020-01-02 DIAGNOSIS — I4891 Unspecified atrial fibrillation: Secondary | ICD-10-CM | POA: Diagnosis not present

## 2020-01-02 DIAGNOSIS — Z87891 Personal history of nicotine dependence: Secondary | ICD-10-CM | POA: Diagnosis not present

## 2020-01-02 DIAGNOSIS — I10 Essential (primary) hypertension: Secondary | ICD-10-CM | POA: Diagnosis not present

## 2020-01-02 DIAGNOSIS — Z7901 Long term (current) use of anticoagulants: Secondary | ICD-10-CM | POA: Diagnosis not present

## 2020-01-02 DIAGNOSIS — E119 Type 2 diabetes mellitus without complications: Secondary | ICD-10-CM | POA: Diagnosis not present

## 2020-01-02 DIAGNOSIS — E785 Hyperlipidemia, unspecified: Secondary | ICD-10-CM | POA: Diagnosis not present

## 2020-01-02 DIAGNOSIS — Z7984 Long term (current) use of oral hypoglycemic drugs: Secondary | ICD-10-CM | POA: Diagnosis not present

## 2020-01-02 DIAGNOSIS — Z8673 Personal history of transient ischemic attack (TIA), and cerebral infarction without residual deficits: Secondary | ICD-10-CM | POA: Diagnosis not present

## 2020-01-27 ENCOUNTER — Telehealth: Payer: Self-pay | Admitting: Neurology

## 2020-01-27 NOTE — Telephone Encounter (Signed)
Looks like this new patient rescheduled his appointment (was tomorrow) to 5/17. Since he has rescheduled, can we try to accommodate the request for this patient to see Dr. Leonie Man instead? Dr. Leonie Man was requested by requesting provider and patient's complaint is stroke. If Dr. Leonie Man does not have anything I can discuss with his nurse. Let me know, he is a new patient and has never been seen in the practice  thanks

## 2020-01-28 ENCOUNTER — Ambulatory Visit: Payer: Medicare Other | Admitting: Neurology

## 2020-01-28 NOTE — Telephone Encounter (Signed)
Patient has been moved to Dr. Clydene Fake schedule

## 2020-02-14 DIAGNOSIS — R739 Hyperglycemia, unspecified: Secondary | ICD-10-CM | POA: Diagnosis not present

## 2020-02-14 DIAGNOSIS — I10 Essential (primary) hypertension: Secondary | ICD-10-CM | POA: Diagnosis not present

## 2020-02-14 DIAGNOSIS — Z0000 Encounter for general adult medical examination without abnormal findings: Secondary | ICD-10-CM | POA: Diagnosis not present

## 2020-02-14 DIAGNOSIS — E538 Deficiency of other specified B group vitamins: Secondary | ICD-10-CM | POA: Diagnosis not present

## 2020-02-14 DIAGNOSIS — E7800 Pure hypercholesterolemia, unspecified: Secondary | ICD-10-CM | POA: Diagnosis not present

## 2020-02-18 ENCOUNTER — Other Ambulatory Visit: Payer: Self-pay

## 2020-02-18 ENCOUNTER — Telehealth: Payer: Self-pay | Admitting: Neurology

## 2020-02-18 ENCOUNTER — Ambulatory Visit: Payer: Medicare Other | Admitting: Neurology

## 2020-02-18 ENCOUNTER — Encounter: Payer: Self-pay | Admitting: Neurology

## 2020-02-18 VITALS — BP 124/74 | HR 61 | Temp 97.4°F | Ht 76.0 in | Wt 247.0 lb

## 2020-02-18 DIAGNOSIS — R569 Unspecified convulsions: Secondary | ICD-10-CM | POA: Diagnosis not present

## 2020-02-18 DIAGNOSIS — I482 Chronic atrial fibrillation, unspecified: Secondary | ICD-10-CM | POA: Diagnosis not present

## 2020-02-18 DIAGNOSIS — Z86011 Personal history of benign neoplasm of the brain: Secondary | ICD-10-CM

## 2020-02-18 DIAGNOSIS — I6310 Cerebral infarction due to embolism of unspecified precerebral artery: Secondary | ICD-10-CM

## 2020-02-18 NOTE — Patient Instructions (Signed)
I had a long discussion with the patient and his daughter regarding what appears to be silent stroke likely embolic from his atrial fibrillation and remote history of meningioma and seizures and answered questions.  Continue Eliquis for stroke prevention given atrial fibrillation and maintain aggressive risk factor modification with blood pressure goal below 130/90, lipids with LDL cholesterol goal below 70 mg percent and diabetes with hemoglobin A1c goal below 6.5%.  Check CT angiogram of the brain and neck.  Continue Keppra 750 mg twice daily for seizure prophylaxis.  He was also advised to maintain a healthy diet with lots of fruits, vegetables, cereals and whole grains and to be active.  We also discussed fall and safety precautions given his poor vision from macular degeneration and being on anticoagulation with Eliquis.  He will return for follow-up in the future in 6 months or call earlier if necessary.  Stroke Prevention Some medical conditions and behaviors are associated with a higher chance of having a stroke. You can help prevent a stroke by making nutrition, lifestyle, and other changes, including managing any medical conditions you may have. What nutrition changes can be made?   Eat healthy foods. You can do this by: ? Choosing foods high in fiber, such as fresh fruits and vegetables and whole grains. ? Eating at least 5 or more servings of fruits and vegetables a day. Try to fill half of your plate at each meal with fruits and vegetables. ? Choosing lean protein foods, such as lean cuts of meat, poultry without skin, fish, tofu, beans, and nuts. ? Eating low-fat dairy products. ? Avoiding foods that are high in salt (sodium). This can help lower blood pressure. ? Avoiding foods that have saturated fat, trans fat, and cholesterol. This can help prevent high cholesterol. ? Avoiding processed and premade foods.  Follow your health care provider's specific guidelines for losing weight,  controlling high blood pressure (hypertension), lowering high cholesterol, and managing diabetes. These may include: ? Reducing your daily calorie intake. ? Limiting your daily sodium intake to 1,500 milligrams (mg). ? Using only healthy fats for cooking, such as olive oil, canola oil, or sunflower oil. ? Counting your daily carbohydrate intake. What lifestyle changes can be made?  Maintain a healthy weight. Talk to your health care provider about your ideal weight.  Get at least 30 minutes of moderate physical activity at least 5 days a week. Moderate activity includes brisk walking, biking, and swimming.  Do not use any products that contain nicotine or tobacco, such as cigarettes and e-cigarettes. If you need help quitting, ask your health care provider. It may also be helpful to avoid exposure to secondhand smoke.  Limit alcohol intake to no more than 1 drink a day for nonpregnant women and 2 drinks a day for men. One drink equals 12 oz of beer, 5 oz of wine, or 1 oz of hard liquor.  Stop any illegal drug use.  Avoid taking birth control pills. Talk to your health care provider about the risks of taking birth control pills if: ? You are over 92 years old. ? You smoke. ? You get migraines. ? You have ever had a blood clot. What other changes can be made?  Manage your cholesterol levels. ? Eating a healthy diet is important for preventing high cholesterol. If cholesterol cannot be managed through diet alone, you may also need to take medicines. ? Take any prescribed medicines to control your cholesterol as told by your health care provider.  Manage your diabetes. ? Eating a healthy diet and exercising regularly are important parts of managing your blood sugar. If your blood sugar cannot be managed through diet and exercise, you may need to take medicines. ? Take any prescribed medicines to control your diabetes as told by your health care provider.  Control your hypertension. ? To  reduce your risk of stroke, try to keep your blood pressure below 130/80. ? Eating a healthy diet and exercising regularly are an important part of controlling your blood pressure. If your blood pressure cannot be managed through diet and exercise, you may need to take medicines. ? Take any prescribed medicines to control hypertension as told by your health care provider. ? Ask your health care provider if you should monitor your blood pressure at home. ? Have your blood pressure checked every year, even if your blood pressure is normal. Blood pressure increases with age and some medical conditions.  Get evaluated for sleep disorders (sleep apnea). Talk to your health care provider about getting a sleep evaluation if you snore a lot or have excessive sleepiness.  Take over-the-counter and prescription medicines only as told by your health care provider. Aspirin or blood thinners (antiplatelets or anticoagulants) may be recommended to reduce your risk of forming blood clots that can lead to stroke.  Make sure that any other medical conditions you have, such as atrial fibrillation or atherosclerosis, are managed. What are the warning signs of a stroke? The warning signs of a stroke can be easily remembered as BEFAST.  B is for balance. Signs include: ? Dizziness. ? Loss of balance or coordination. ? Sudden trouble walking.  E is for eyes. Signs include: ? A sudden change in vision. ? Trouble seeing.  F is for face. Signs include: ? Sudden weakness or numbness of the face. ? The face or eyelid drooping to one side.  A is for arms. Signs include: ? Sudden weakness or numbness of the arm, usually on one side of the body.  S is for speech. Signs include: ? Trouble speaking (aphasia). ? Trouble understanding.  T is for time. ? These symptoms may represent a serious problem that is an emergency. Do not wait to see if the symptoms will go away. Get medical help right away. Call your local  emergency services (911 in the [place] Do not drive yourself to the hospital.  Other signs of stroke may include: ? A sudden, severe headache with no known cause. ? Nausea or vomiting. ? Seizure. Where to find more information For more information, visit:  American Stroke Association: www.strokeassociation.org  National Stroke Association: www.stroke.org Summary  You can prevent a stroke by eating healthy, exercising, not smoking, limiting alcohol intake, and managing any medical conditions you may have.  Do not use any products that contain nicotine or tobacco, such as cigarettes and e-cigarettes. If you need help quitting, ask your health care provider. It may also be helpful to avoid exposure to secondhand smoke.  Remember BEFAST for warning signs of stroke. Get help right away if you or a loved one has any of these signs. This information is not intended to replace advice given to you by your health care provider. Make sure you discuss any questions you have with your health care provider. Document Revised: 09/15/2017 Document Reviewed: 11/08/2016 Elsevier Patient Education  2020 [employer].

## 2020-02-18 NOTE — Telephone Encounter (Signed)
[employer] medicare order sent to [facility]. No auth they will reach out to the patient to schedule.  

## 2020-02-18 NOTE — Progress Notes (Signed)
[facility] [address] [place]. [place] [address] [phone]       OFFICE CONSULT NOTE  Mr. Pong Paye Ode Date of Birth:  1941/02/17 Medical Record Number:  [id number]   Referring MD: Kary Kos Reason for Referral: Stroke HPI: Mr. Deneke is a pleasant 79 year old Caucasian male seen today for initial office consultation visit for stroke.  History is obtained from the patient, his daughter who is accompanying him and review of referral notes and electronic medical records.  I personally reviewed imaging films available in PACS.  He has a past medical history of hypertension, diabetes, hyperlipidemia, remote left frontal meningioma status post surgery in 2010 with symptomatic seizures on long-term Keppra.  Patient was seen by Dr. Saintclair Halsted for back pain and underwent MRI of the lumbar spine as well as MRI scan of the brain at [facility] on 12/08/2019 which showed tiny punctate left frontoparietal subacute infarct.  Patient had no lateralizing clinical symptoms related to this.  He denied history of headaches, slurred speech, facial droop, extremity weakness numbness or gait difficulties.  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  MRI also showed that he had a left parasagittal frontal meningioma which was slightly increased in size compared to previous study from 2010.  Patient states he has had no breakthrough seizures ever since his surgery in 2010.  He is tolerating Keppra 750 twice daily without any side effects.  He has had no prior history of strokes TIA migraine headaches.  He lives in independent living apartment.  He has limited vision due to bilateral macular degeneration but is able to walk independently and has had no major falls or injuries.  He has been on Eliquis for atrial fibrillation and is tolerating well with minor bruising but no major bleeding episodes.  He states he is tolerating his blood  pressure diabetes and cholesterol medications well.  He had lab work done by his primary physician last month which I do not have access to but apparently was satisfactory.  He also had echocardiogram done what seems like a year ago which was satisfactory. ROS:   14 system review of systems is positive for poor vision, tremor, gait difficulties, bruising and all systems negative  PMH:  Past Medical History:  Diagnosis Date  . Benign prostatic hypertrophy   . Depression   . Dysphagia   . Essential tremor   . History of surgery of head   . Hyperglycemia   . Hyperlipemia   . Hypertension   . Memory loss   . Meningioma ([place])   . Prediabetes   . Seizures ([place])   . Vision abnormalities   . Vitamin B12 deficiency   . Vitamin D deficiency     Social History:  Social History   Socioeconomic History  . Marital status: Single    Spouse name: Not on file  . Number of children: Not on file  . Years of education: Not on file  . Highest education level: Not on file  Occupational History  . Not on file  Tobacco Use  . Smoking status: Never Smoker  . Smokeless tobacco: Never Used  Substance and Sexual Activity  . Alcohol use: Not Currently  . Drug use: Not Currently  . Sexual activity: Not on file  Other Topics Concern  . Not on file  Social History Narrative  . Not on file   Social Determinants of Health   Financial Resource Strain:   .  Difficulty of Paying Living Expenses:   Food Insecurity:   . Worried About Charity fundraiser in the Last Year:   . Arboriculturist in the Last Year:   Transportation Needs:   . Film/video editor (Medical):   Marland Kitchen Lack of Transportation (Non-Medical):   Physical Activity:   . Days of Exercise per Week:   . Minutes of Exercise per Session:   Stress:   . Feeling of Stress :   Social Connections:   . Frequency of Communication with Friends and Family:   . Frequency of Social Gatherings with Friends and Family:   . Attends Religious  Services:   . Active Member of Clubs or Organizations:   . Attends Archivist Meetings:   Marland Kitchen Marital Status:   Intimate Partner Violence:   . Fear of Current or Ex-Partner:   . Emotionally Abused:   Marland Kitchen Physically Abused:   . Sexually Abused:     Medications:   Current Outpatient Medications on File Prior to Visit  Medication Sig Dispense Refill  . amLODipine (NORVASC) 5 MG tablet Take 5 mg by mouth daily. For 30 days    . bethanechol (URECHOLINE) 25 MG tablet Take 25 mg by mouth 2 (two) times daily.    . Calcium-Magnesium-Vitamin D (CALCIUM 500 PO) Take 1,250 mg by mouth daily.    . cyanocobalamin 2000 MCG tablet Take 2,000 mcg by mouth 2 (two) times daily.    Marland Kitchen escitalopram (LEXAPRO) 20 MG tablet Take 20 mg by mouth daily.    . famotidine (PEPCID) 20 MG tablet Take 20 mg by mouth daily.    Marland Kitchen levETIRAcetam (KEPPRA) 750 MG tablet Take 750 mg by mouth every 6 (six) hours.    . metFORMIN (GLUCOPHAGE) 500 MG tablet Take by mouth 2 (two) times daily with a meal. For 30 days.    . pravastatin (PRAVACHOL) 40 MG tablet Take 40 mg by mouth at bedtime.    . propranolol (INNOPRAN XL) 80 MG 24 hr capsule Take 80 mg by mouth daily.    . tamsulosin (FLOMAX) 0.4 MG CAPS capsule Take 0.4 mg by mouth. Take 2 caps one daily 1/2 hour following the same meal each day.    . Vitamin D, Ergocalciferol, (DRISDOL) 50000 UNITS CAPS capsule Take 50,000 Units by mouth every 7 (seven) days.     No current facility-administered medications on file prior to visit.    Allergies:  No Known Allergies  Physical Exam General: Mildly obese elderly Caucasian male seated, in no evident distress Head: head normocephalic and atraumatic.   Neck: supple with no carotid or supraclavicular bruits Cardiovascular: regular rate and rhythm, no murmurs Musculoskeletal: no deformity Skin:  no rash/petichiae Vascular:  Normal pulses all extremities  Neurologic Exam Mental Status: Awake and fully alert. Oriented to  place and time. Recent and remote memory intact. Attention span, concentration and fund of knowledge appropriate.  Mildly diminished recall mood and affect appropriate.  Cranial Nerves: Fundoscopic exam reveals sharp disc margins. Pupils equal, briskly reactive to light. Extraocular movements full without nystagmus. Visual fields not adequately tested due to poor vision acuity from macular degeneration bilaterally.Marland Kitchen Hearing is reduced t. Facial sensation intact. Face, tongue, palate moves normally and symmetrically.  Motor: Normal bulk and tone. Normal strength in all tested extremity muscles.  Mild fine action tremor bilateral upper extremity left greater than right.  No cogwheel rigidity or bradykinesia. Sensory.: intact to touch , pinprick , position and vibratory sensation.  Coordination:  Rapid alternating movements normal in all extremities. Finger-to-nose and heel-to-shin performed accurately bilaterally. Gait and Station: Arises from chair without difficulty. Stance is normal. Gait demonstrates normal stride length and balance . Able to heel, toe and tandem walk with moderate difficulty.  Reflexes: 1+ and symmetric. Toes downgoing.   NIHSS  0 Modified Rankin  0   ASSESSMENT: 79 year old Caucasian male with what appears to be silent left frontal embolic infarct from atrial fibrillation despite anticoagulation with Eliquis.  Vascular risk factors of atrial fibrillation, diabetes, hypertension, hyperlipidemia age.  Remote history of left frontal meningioma s/p surgery in 2010 with symptomatic seizures well-controlled on Keppra     PLAN: I had a long discussion with the patient and his daughter regarding what appears to be silent stroke likely embolic from his atrial fibrillation and remote history of meningioma and seizures and answered questions.  Continue Eliquis for stroke prevention given atrial fibrillation and maintain aggressive risk factor modification with blood pressure goal below  130/90, lipids with LDL cholesterol goal below 70 mg percent and diabetes with hemoglobin A1c goal below 6.5%.  Check CT angiogram of the brain and neck.  Continue Keppra 750 mg twice daily for seizure prophylaxis.  He was also advised to maintain a healthy diet with lots of fruits, vegetables, cereals and whole grains and to be active.  We also discussed fall and safety precautions given his poor vision from macular degeneration and being on anticoagulation with Eliquis.  Greater than 50% time during this 45-minute consultation visit was spent on counseling and coordination of care about his silent infarct and atrial fibrillation and remote history of seizures and meningioma surgery and answering questions he will return for follow-up in the future in 6 months or call earlier if necessary. Antony Contras, MD  [facility] [address] [place], [place] [address]  Phone [phone] Fax [phone] Note: This document was prepared with digital dictation and possible smart phrase technology. Any transcriptional errors that result from this process are unintentional.

## 2020-03-02 ENCOUNTER — Ambulatory Visit: Payer: Medicare Other | Admitting: Neurology

## 2020-03-05 ENCOUNTER — Inpatient Hospital Stay: Admission: RE | Admit: 2020-03-05 | Payer: Medicare Other | Source: Ambulatory Visit

## 2020-05-25 DIAGNOSIS — R739 Hyperglycemia, unspecified: Secondary | ICD-10-CM | POA: Diagnosis not present

## 2020-05-25 DIAGNOSIS — I10 Essential (primary) hypertension: Secondary | ICD-10-CM | POA: Diagnosis not present

## 2020-07-14 ENCOUNTER — Encounter (INDEPENDENT_AMBULATORY_CARE_PROVIDER_SITE_OTHER): Payer: Self-pay | Admitting: Ophthalmology

## 2020-07-14 ENCOUNTER — Other Ambulatory Visit: Payer: Self-pay

## 2020-07-14 ENCOUNTER — Ambulatory Visit (INDEPENDENT_AMBULATORY_CARE_PROVIDER_SITE_OTHER): Payer: Medicare Other | Admitting: Ophthalmology

## 2020-07-14 DIAGNOSIS — H353222 Exudative age-related macular degeneration, left eye, with inactive choroidal neovascularization: Secondary | ICD-10-CM

## 2020-07-14 DIAGNOSIS — H2513 Age-related nuclear cataract, bilateral: Secondary | ICD-10-CM | POA: Diagnosis not present

## 2020-07-14 DIAGNOSIS — H353212 Exudative age-related macular degeneration, right eye, with inactive choroidal neovascularization: Secondary | ICD-10-CM

## 2020-07-14 NOTE — Assessment & Plan Note (Signed)
David Casey 1 day need cataract extraction intraocular lens placement when the peripheral vision is hampered by the degree of cataract opacification

## 2020-07-14 NOTE — Progress Notes (Signed)
07/14/2020     CHIEF COMPLAINT Patient presents for Retina Follow Up   HISTORY OF PRESENT ILLNESS: David Casey is a 79 y.o. male who presents to the clinic today for:   HPI    Retina Follow Up    Patient presents with  Dry AMD.  In both eyes.  Severity is severe.  Duration of 1 year.  Since onset it is stable.  I, the attending physician,  performed the HPI with the patient and updated documentation appropriately.          Comments    1 Year Dry AMD f\u OU. OCT  Pt states OU vision has declined. Pt states his center vision is terrible.        Last edited by Tilda Franco on 07/14/2020  9:55 AM. (History)      Referring physician: Townsend Roger, MD [address] [place],  [place] [address]  HISTORICAL INFORMATION:   Selected notes from the MEDICAL RECORD NUMBER       CURRENT MEDICATIONS: No current outpatient medications on file. (Ophthalmic Drugs)   No current facility-administered medications for this visit. (Ophthalmic Drugs)   Current Outpatient Medications (Other)  Medication Sig  . amLODipine (NORVASC) 5 MG tablet Take 5 mg by mouth daily. For 30 days  . apixaban (ELIQUIS) 5 MG TABS tablet Take by mouth.  . bethanechol (URECHOLINE) 25 MG tablet Take 25 mg by mouth 2 (two) times daily.  . Calcium-Magnesium-Vitamin D (CALCIUM 500 PO) Take 1,250 mg by mouth daily.  . cyanocobalamin 2000 MCG tablet Take 2,000 mcg by mouth 2 (two) times daily.  Marland Kitchen escitalopram (LEXAPRO) 20 MG tablet Take 20 mg by mouth daily.  . famotidine (PEPCID) 20 MG tablet Take 20 mg by mouth daily.  Marland Kitchen levETIRAcetam (KEPPRA) 750 MG tablet Take 750 mg by mouth every 6 (six) hours.  . metFORMIN (GLUCOPHAGE) 500 MG tablet Take by mouth 2 (two) times daily with a meal. For 30 days.  . pravastatin (PRAVACHOL) 40 MG tablet Take 40 mg by mouth at bedtime.  . propranolol (INNOPRAN XL) 80 MG 24 hr capsule Take 80 mg by mouth daily.  . tamsulosin (FLOMAX) 0.4 MG CAPS capsule Take 0.4 mg by  mouth. Take 2 caps one daily 1/2 hour following the same meal each day.  . Vitamin D, Ergocalciferol, (DRISDOL) 50000 UNITS CAPS capsule Take 50,000 Units by mouth every 7 (seven) days.   No current facility-administered medications for this visit. (Other)      REVIEW OF SYSTEMS:    ALLERGIES No Known Allergies  PAST MEDICAL HISTORY Past Medical History:  Diagnosis Date  . Benign prostatic hypertrophy   . Depression   . Dysphagia   . Essential tremor   . History of surgery of head   . Hyperglycemia   . Hyperlipemia   . Hypertension   . Memory loss   . Meningioma ([place])   . Prediabetes   . Seizures ([place])   . Vision abnormalities   . Vitamin B12 deficiency   . Vitamin D deficiency    Past Surgical History:  Procedure Laterality Date  . APPENDECTOMY    . CRANIOTOMY      FAMILY HISTORY History reviewed. No pertinent family history.  SOCIAL HISTORY Social History   Tobacco Use  . Smoking status: Never Smoker  . Smokeless tobacco: Never Used  Substance Use Topics  . Alcohol use: Not Currently  . Drug use: Not Currently  OPHTHALMIC EXAM: Base Eye Exam    Visual Acuity (Snellen - Linear)      Right Left   Dist [place] CF @ 2' - CF @ 2' -   Dist ph [place] NI NI       Tonometry (Tonopen, 10:00 AM)      Right Left   Pressure 20 20       Pupils      Pupils Dark Light Shape React APD   Right PERRL 2 2 Irregular Minimal None   Left PERRL 2 2 Round Minimal None       Visual Fields (Counting fingers)      Left Right    Full Full       Neuro/Psych    Oriented x3: Yes   Mood/Affect: Normal       Dilation    Both eyes: 1.0% Mydriacyl, 2.5% Phenylephrine @ 10:00 AM        Slit Lamp and Fundus Exam    External Exam      Right Left   External Normal Normal       Slit Lamp Exam      Right Left   Lids/Lashes Normal Normal   Conjunctiva/Sclera White and quiet White and quiet   Cornea Clear Clear   Anterior Chamber Deep and quiet Deep and quiet    Iris Round and reactive Round and reactive   Lens 3+ Nuclear sclerosis 3+ Nuclear sclerosis   Anterior Vitreous Normal Normal       Fundus Exam      Right Left   Posterior Vitreous Normal Normal   Disc Normal Normal   C/D Ratio 0.35 0.35   Macula Disciform scar, encompasses the entire clinicians macula and beyond Disciform scar, encompasses the entire clinicians macula and beyond, elevation of the and thinning of the atrophic approaching 5 to 6 mm in elevation overlying the disciform scar   Vessels Normal Normal   Periphery Normal Normal          IMAGING AND PROCEDURES  Imaging and Procedures for 07/14/20  OCT, Retina - OU - Both Eyes       Right Eye Quality was borderline. Scan locations included subfoveal. Progression has been stable.   Left Eye Quality was borderline. Scan locations included subfoveal. Progression has been stable.   Notes Foveal disciform scar OD, massive in size, no active edges.  OS with atrophic retinal detachment serous overlying large macular disciform scar.  Over time                ASSESSMENT/PLAN:  Nuclear sclerotic cataract of both eyes Will 1 day need cataract extraction intraocular lens placement when the peripheral vision is hampered by the degree of cataract opacification  Exudative age-related macular degeneration of left eye with inactive choroidal neovascularization ([place]) OU with similar findings, no specific therapy is available or appropriate.  Moreover there is no significant signs of progression no angina or lesional changes prior scars      ICD-10-CM   1. Exudative age-related macular degeneration of left eye with inactive choroidal neovascularization (HCC)  H35.3222 OCT, Retina - OU - Both Eyes  2. Exudative age-related macular degeneration of right eye with inactive choroidal neovascularization (HCC)  H35.3212 OCT, Retina - OU - Both Eyes  3. Nuclear sclerotic cataract of both eyes  H25.13      1.  2.  3.  Ophthalmic Meds Ordered this visit:  No orders of the defined types were placed in this encounter.  Return in about 2 years (around 07/14/2022) for DILATE OU, COLOR FP.  There are no Patient Instructions on file for this visit.   Explained the diagnoses, plan, and follow up with the patient and they expressed understanding.  Patient expressed understanding of the importance of proper follow up care.   Clent Demark Tyshika Baldridge M.D. Diseases & Surgery of the Retina and Vitreous [facility] [place] 07/14/20     Abbreviations: M myopia (nearsighted); A astigmatism; H hyperopia (farsighted); P presbyopia; Mrx spectacle prescription;  CTL contact lenses; OD right eye; OS left eye; OU both eyes  XT exotropia; ET esotropia; PEK punctate epithelial keratitis; PEE punctate epithelial erosions; DES dry eye syndrome; MGD meibomian gland dysfunction; ATs artificial tears; PFAT's preservative free artificial tears; [place] nuclear sclerotic cataract; PSC posterior subcapsular cataract; ERM epi-retinal membrane; PVD posterior vitreous detachment; RD retinal detachment; DM diabetes mellitus; DR diabetic retinopathy; NPDR non-proliferative diabetic retinopathy; PDR proliferative diabetic retinopathy; CSME clinically significant macular edema; DME diabetic macular edema; dbh dot blot hemorrhages; CWS cotton wool spot; POAG primary open angle glaucoma; C/D cup-to-disc ratio; HVF humphrey visual field; GVF goldmann visual field; OCT optical coherence tomography; IOP intraocular pressure; BRVO Branch retinal vein occlusion; CRVO central retinal vein occlusion; CRAO central retinal artery occlusion; BRAO branch retinal artery occlusion; RT retinal tear; SB scleral buckle; PPV pars plana vitrectomy; VH Vitreous hemorrhage; PRP panretinal laser photocoagulation; IVK intravitreal kenalog; VMT vitreomacular traction; MH Macular hole;  NVD neovascularization of the disc; NVE neovascularization  elsewhere; AREDS age related eye disease study; ARMD age related macular degeneration; POAG primary open angle glaucoma; EBMD epithelial/anterior basement membrane dystrophy; ACIOL anterior chamber intraocular lens; IOL intraocular lens; PCIOL posterior chamber intraocular lens; Phaco/IOL phacoemulsification with intraocular lens placement; [place] photorefractive keratectomy; LASIK laser assisted in situ keratomileusis; HTN hypertension; DM diabetes mellitus; COPD chronic obstructive pulmonary disease

## 2020-07-14 NOTE — Assessment & Plan Note (Signed)
OU with similar findings, no specific therapy is available or appropriate.  Moreover there is no significant signs of progression no angina or lesional changes prior scars

## 2020-08-20 ENCOUNTER — Ambulatory Visit: Payer: Medicare Other | Admitting: Neurology

## 2020-08-26 DIAGNOSIS — I10 Essential (primary) hypertension: Secondary | ICD-10-CM | POA: Diagnosis not present

## 2020-08-26 DIAGNOSIS — I4891 Unspecified atrial fibrillation: Secondary | ICD-10-CM | POA: Diagnosis not present

## 2020-12-18 DIAGNOSIS — R7309 Other abnormal glucose: Secondary | ICD-10-CM | POA: Diagnosis not present

## 2020-12-18 DIAGNOSIS — R739 Hyperglycemia, unspecified: Secondary | ICD-10-CM | POA: Diagnosis not present

## 2020-12-18 DIAGNOSIS — I10 Essential (primary) hypertension: Secondary | ICD-10-CM | POA: Diagnosis not present

## 2020-12-18 DIAGNOSIS — R7303 Prediabetes: Secondary | ICD-10-CM | POA: Diagnosis not present

## 2021-03-22 DIAGNOSIS — R7303 Prediabetes: Secondary | ICD-10-CM | POA: Diagnosis not present

## 2021-04-22 ENCOUNTER — Encounter: Payer: Self-pay | Admitting: Gastroenterology

## 2021-05-06 DIAGNOSIS — D320 Benign neoplasm of cerebral meninges: Secondary | ICD-10-CM | POA: Diagnosis not present

## 2021-05-19 DIAGNOSIS — R220 Localized swelling, mass and lump, head: Secondary | ICD-10-CM | POA: Diagnosis not present

## 2021-05-19 DIAGNOSIS — G9389 Other specified disorders of brain: Secondary | ICD-10-CM | POA: Diagnosis not present

## 2021-05-19 DIAGNOSIS — D320 Benign neoplasm of cerebral meninges: Secondary | ICD-10-CM | POA: Diagnosis not present

## 2021-05-19 DIAGNOSIS — Z9889 Other specified postprocedural states: Secondary | ICD-10-CM | POA: Diagnosis not present

## 2021-05-28 ENCOUNTER — Ambulatory Visit: Payer: Medicare Other | Admitting: Gastroenterology

## 2021-06-01 DIAGNOSIS — D320 Benign neoplasm of cerebral meninges: Secondary | ICD-10-CM | POA: Diagnosis not present

## 2021-06-23 DIAGNOSIS — D329 Benign neoplasm of meninges, unspecified: Secondary | ICD-10-CM | POA: Diagnosis not present

## 2021-06-23 DIAGNOSIS — Z0000 Encounter for general adult medical examination without abnormal findings: Secondary | ICD-10-CM | POA: Diagnosis not present

## 2021-06-23 DIAGNOSIS — R7303 Prediabetes: Secondary | ICD-10-CM | POA: Diagnosis not present

## 2021-06-23 DIAGNOSIS — I10 Essential (primary) hypertension: Secondary | ICD-10-CM | POA: Diagnosis not present

## 2021-06-23 DIAGNOSIS — E7800 Pure hypercholesterolemia, unspecified: Secondary | ICD-10-CM | POA: Diagnosis not present

## 2021-06-23 DIAGNOSIS — I4891 Unspecified atrial fibrillation: Secondary | ICD-10-CM | POA: Diagnosis not present

## 2021-06-23 DIAGNOSIS — G250 Essential tremor: Secondary | ICD-10-CM | POA: Diagnosis not present

## 2021-06-23 DIAGNOSIS — N1831 Chronic kidney disease, stage 3a: Secondary | ICD-10-CM | POA: Diagnosis not present

## 2021-07-16 DIAGNOSIS — I10 Essential (primary) hypertension: Secondary | ICD-10-CM | POA: Diagnosis not present

## 2021-09-23 DIAGNOSIS — I10 Essential (primary) hypertension: Secondary | ICD-10-CM | POA: Diagnosis not present

## 2021-09-23 DIAGNOSIS — N1831 Chronic kidney disease, stage 3a: Secondary | ICD-10-CM | POA: Diagnosis not present

## 2021-12-23 DIAGNOSIS — R7303 Prediabetes: Secondary | ICD-10-CM | POA: Diagnosis not present

## 2021-12-23 DIAGNOSIS — N1831 Chronic kidney disease, stage 3a: Secondary | ICD-10-CM | POA: Diagnosis not present

## 2021-12-23 DIAGNOSIS — I10 Essential (primary) hypertension: Secondary | ICD-10-CM | POA: Diagnosis not present

## 2022-01-05 DIAGNOSIS — L821 Other seborrheic keratosis: Secondary | ICD-10-CM | POA: Diagnosis not present

## 2022-01-05 DIAGNOSIS — C4402 Squamous cell carcinoma of skin of lip: Secondary | ICD-10-CM | POA: Diagnosis not present

## 2022-03-28 DIAGNOSIS — N1831 Chronic kidney disease, stage 3a: Secondary | ICD-10-CM | POA: Diagnosis not present

## 2022-03-28 DIAGNOSIS — I10 Essential (primary) hypertension: Secondary | ICD-10-CM | POA: Diagnosis not present

## 2022-03-28 DIAGNOSIS — R7303 Prediabetes: Secondary | ICD-10-CM | POA: Diagnosis not present

## 2022-03-28 DIAGNOSIS — I4891 Unspecified atrial fibrillation: Secondary | ICD-10-CM | POA: Diagnosis not present

## 2022-07-04 DIAGNOSIS — I4891 Unspecified atrial fibrillation: Secondary | ICD-10-CM | POA: Diagnosis not present

## 2022-07-04 DIAGNOSIS — N1831 Chronic kidney disease, stage 3a: Secondary | ICD-10-CM | POA: Diagnosis not present

## 2022-07-04 DIAGNOSIS — R7303 Prediabetes: Secondary | ICD-10-CM | POA: Diagnosis not present

## 2022-07-04 DIAGNOSIS — I10 Essential (primary) hypertension: Secondary | ICD-10-CM | POA: Diagnosis not present

## 2022-07-04 DIAGNOSIS — D329 Benign neoplasm of meninges, unspecified: Secondary | ICD-10-CM | POA: Diagnosis not present

## 2022-07-04 DIAGNOSIS — Z0000 Encounter for general adult medical examination without abnormal findings: Secondary | ICD-10-CM | POA: Diagnosis not present

## 2022-07-04 DIAGNOSIS — E7800 Pure hypercholesterolemia, unspecified: Secondary | ICD-10-CM | POA: Diagnosis not present

## 2022-07-14 ENCOUNTER — Encounter (INDEPENDENT_AMBULATORY_CARE_PROVIDER_SITE_OTHER): Payer: Medicare Other | Admitting: Ophthalmology

## 2022-08-15 DIAGNOSIS — J0140 Acute pansinusitis, unspecified: Secondary | ICD-10-CM | POA: Diagnosis not present

## 2022-08-16 ENCOUNTER — Encounter (INDEPENDENT_AMBULATORY_CARE_PROVIDER_SITE_OTHER): Payer: Medicare Other | Admitting: Ophthalmology

## 2022-10-05 ENCOUNTER — Ambulatory Visit: Payer: Medicare Other | Admitting: Internal Medicine

## 2022-10-05 ENCOUNTER — Encounter: Payer: Self-pay | Admitting: Internal Medicine

## 2022-10-05 VITALS — BP 120/70 | HR 60 | Temp 97.2°F | Resp 16 | Ht 76.0 in | Wt 233.6 lb

## 2022-10-05 DIAGNOSIS — I10 Essential (primary) hypertension: Secondary | ICD-10-CM | POA: Insufficient documentation

## 2022-10-05 DIAGNOSIS — N1831 Chronic kidney disease, stage 3a: Secondary | ICD-10-CM | POA: Diagnosis not present

## 2022-10-05 DIAGNOSIS — I480 Paroxysmal atrial fibrillation: Secondary | ICD-10-CM | POA: Diagnosis not present

## 2022-10-05 DIAGNOSIS — R7303 Prediabetes: Secondary | ICD-10-CM

## 2022-10-05 NOTE — Assessment & Plan Note (Signed)
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  He remains on farxiga at this time.  He does continue with exercise.

## 2022-10-05 NOTE — Assessment & Plan Note (Signed)
His A. Fib seems rare controlled on my exam today.  He remains on eliquis and denies any symptoms.

## 2022-10-05 NOTE — Assessment & Plan Note (Signed)
His BP is excellent and we will contiue on his amlodipine.

## 2022-10-05 NOTE — Assessment & Plan Note (Signed)
His prediabetes is controlled.  We will check his HgBA1c on his next visit.

## 2022-10-05 NOTE — Progress Notes (Signed)
Office Visit  Subjective   Patient ID: David Casey   DOB: 1941-08-12   Age: 81 y.o.   MRN: [id number]   Chief Complaint Chief Complaint  Patient presents with   Follow-up    Diabetes     History of Present Illness Mr. Patriarca is a 81 year old male who come in today for followup of his Atrial Fib.  He was diagnosed with A. Fib in 06/2018 where he was seen in the ER for dizziness and sweating.  On workup, he was found to have new onset A. Fib with a HR in the 70's.  It did go into [facility] where they admitted him and gave him a dose of propranolol.  However, it caused him to have asymptomatic bradycardia.  I saw him back in 04/2018 where he was having bradycardia in the 40's with syncope.  We did an EKG at that time that showed some nonspecific t waves and discontinued his Inderal LA at that time.  In the hospital, they called me and stopped his propranolol.  I told them he was only on amlodipine and that is what they discharged him home on.  His EKG in the hospital showed Paroxsymal A. fib.  They did an ECHO in 06/2018 and this showed impaired relaxation but a normal LVEF of 55-60%.  They did start him on Eliquis as his CHADVASC2 score was 4.  I did refer him to Dr. Ola Spurr in 2019 where they did a lexiscan nuclear stress test that was negative and showed an EF 70%.  They also placed a zio patch at that time and he had A. Fib with a controlled rate.  Today, he denies any chest pain, SOB, palpitations, weakness, swelling, bruising, bleeding, BRBPR or melena at this time.  He is not on any rate control medication and remains on Eliquis '5mg'$  BID.   The patient is a 81 year old Caucasian/White male who returns for a follow-up visit for his prediabetes. Since the last visit, there have been no problems. He remains on metformin Oral Tablet 500 mg BID for his prediabetes. He specifically denies unexplained abdominal pain, nausea or vomiting and documented hypoglycemia. He does not routinely check  blood sugars.  His last HgBA1c was performed 3 months ago and was 6.4%.  He came in fasting today in anticipation of lab work.  There are no complications of his prediabetes.  The patient is a 81 year old Caucasian/White male who presents for a follow-up evaluation of hypertension. The patient has not been checking his blood pressure at home.  Since his last visit, he has not had any problems. The patient's current medications include: amlodipine 5 mg daily. The patient has been tolerating his medications well. The patient denies any headache, visual changes, dizziness, lightheadness, chest pain, shortness of breath, weakness/numbness, pain, and edema.  He reports wearing compression stockings to prevent BLE edema in the past but he has not had any recent edema.  He reports he is eating well, denies using additive salt.   He has a history of Stage IIIa CKD which we have noted on his previous labs and visits.  He denies any NSAID use.  His CKD is probably due to his hypertension.  His niece states he is not drinking enough water at times.  He is currently on farxiga for his history of CKD.  His creatinine usually runs 1.2-1.4 and his GFR runs 50-60.         Past Medical History Past  Medical History:  Diagnosis Date   Atrial fibrillation ([place])    Benign prostatic hypertrophy    CKD (chronic kidney disease), stage III (HCC)    Depression    Dysphagia    Essential tremor    History of surgery of head    Hyperglycemia    Hyperlipemia    Hypertension    Memory loss    Meningioma (HCC)    Prediabetes    Seizures (HCC)    Vision abnormalities    Vitamin B12 deficiency    Vitamin D deficiency      Allergies No Known Allergies   Review of Systems Review of Systems  Constitutional:  Negative for chills and fever.  Respiratory:  Negative for cough and shortness of breath.   Cardiovascular:  Negative for chest pain, palpitations and leg swelling.  Gastrointestinal:  Negative for abdominal  pain, constipation, diarrhea, nausea and vomiting.  Musculoskeletal:  Negative for myalgias.  Skin:  Negative for itching and rash.  Neurological:  Negative for dizziness, weakness and headaches.  Endo/Heme/Allergies:  Does not bruise/bleed easily.  Psychiatric/Behavioral:  Negative for depression. The patient is not nervous/anxious.        Objective:    Vitals BP 120/70   Pulse 60   Temp (!) 97.2 F (36.2 C)   Resp 16   Ht '6\' 4"'$  (1.93 m)   Wt 233 lb 9.6 oz (106 kg)   SpO2 98%   BMI 28.43 kg/m    Physical Examination Physical Exam Constitutional:      Appearance: Normal appearance. He is not ill-appearing.  Cardiovascular:     Rate and Rhythm: Normal rate and regular rhythm.     Pulses: Normal pulses.     Heart sounds: No murmur heard.    No friction rub. No gallop.  Pulmonary:     Effort: Pulmonary effort is normal. No respiratory distress.     Breath sounds: No wheezing, rhonchi or rales.  Abdominal:     General: Abdomen is flat. Bowel sounds are normal. There is no distension.     Palpations: Abdomen is soft.     Tenderness: There is no abdominal tenderness.  Musculoskeletal:     Right lower leg: No edema.     Left lower leg: No edema.  Skin:    General: Skin is warm and dry.     Findings: No rash.  Neurological:     General: No focal deficit present.     Mental Status: He is alert and oriented to person, place, and time.  Psychiatric:        Mood and Affect: Mood normal.        Behavior: Behavior normal.        Assessment & Plan:   Paroxysmal atrial fibrillation (HCC) His A. Fib seems rare controlled on my exam today.  He remains on eliquis and denies any symptoms.  Essential hypertension His BP is excellent and we will contiue on his amlodipine.  Stage 3a chronic kidney disease ([place]) He has Stage IIIa CKD due to his HTN.  He remains on farxiga at this time.  He does continue with exercise.  Prediabetes His prediabetes is controlled.  We will  check his HgBA1c on his next visit.    Return in about 3 months (around 01/04/2023).   Townsend Roger, MD

## 2022-11-17 ENCOUNTER — Other Ambulatory Visit: Payer: Self-pay | Admitting: Internal Medicine

## 2022-11-19 DIAGNOSIS — E7800 Pure hypercholesterolemia, unspecified: Secondary | ICD-10-CM | POA: Diagnosis not present

## 2022-11-19 DIAGNOSIS — Z8673 Personal history of transient ischemic attack (TIA), and cerebral infarction without residual deficits: Secondary | ICD-10-CM | POA: Diagnosis not present

## 2022-11-19 DIAGNOSIS — E119 Type 2 diabetes mellitus without complications: Secondary | ICD-10-CM | POA: Diagnosis not present

## 2022-11-19 DIAGNOSIS — Z7984 Long term (current) use of oral hypoglycemic drugs: Secondary | ICD-10-CM | POA: Diagnosis not present

## 2022-11-19 DIAGNOSIS — Z79899 Other long term (current) drug therapy: Secondary | ICD-10-CM | POA: Diagnosis not present

## 2022-11-19 DIAGNOSIS — I4891 Unspecified atrial fibrillation: Secondary | ICD-10-CM | POA: Diagnosis not present

## 2022-11-19 DIAGNOSIS — Z7901 Long term (current) use of anticoagulants: Secondary | ICD-10-CM | POA: Diagnosis not present

## 2022-11-19 DIAGNOSIS — R6889 Other general symptoms and signs: Secondary | ICD-10-CM | POA: Diagnosis not present

## 2022-11-19 DIAGNOSIS — K219 Gastro-esophageal reflux disease without esophagitis: Secondary | ICD-10-CM | POA: Diagnosis not present

## 2022-11-19 DIAGNOSIS — R9431 Abnormal electrocardiogram [ECG] [EKG]: Secondary | ICD-10-CM | POA: Diagnosis not present

## 2022-11-19 DIAGNOSIS — R55 Syncope and collapse: Secondary | ICD-10-CM | POA: Diagnosis not present

## 2022-11-19 DIAGNOSIS — I10 Essential (primary) hypertension: Secondary | ICD-10-CM | POA: Diagnosis not present

## 2022-11-19 DIAGNOSIS — Z743 Need for continuous supervision: Secondary | ICD-10-CM | POA: Diagnosis not present

## 2022-11-21 ENCOUNTER — Ambulatory Visit: Payer: Medicare Other

## 2022-12-01 DIAGNOSIS — I129 Hypertensive chronic kidney disease with stage 1 through stage 4 chronic kidney disease, or unspecified chronic kidney disease: Secondary | ICD-10-CM | POA: Diagnosis not present

## 2022-12-01 DIAGNOSIS — F0390 Unspecified dementia without behavioral disturbance: Secondary | ICD-10-CM

## 2022-12-01 DIAGNOSIS — I4891 Unspecified atrial fibrillation: Secondary | ICD-10-CM | POA: Diagnosis not present

## 2022-12-01 DIAGNOSIS — N183 Chronic kidney disease, stage 3 unspecified: Secondary | ICD-10-CM | POA: Diagnosis not present

## 2022-12-01 DIAGNOSIS — R7303 Prediabetes: Secondary | ICD-10-CM | POA: Diagnosis not present

## 2022-12-01 DIAGNOSIS — R569 Unspecified convulsions: Secondary | ICD-10-CM | POA: Diagnosis not present

## 2022-12-13 DIAGNOSIS — E782 Mixed hyperlipidemia: Secondary | ICD-10-CM | POA: Diagnosis not present

## 2022-12-13 DIAGNOSIS — E038 Other specified hypothyroidism: Secondary | ICD-10-CM | POA: Diagnosis not present

## 2022-12-13 DIAGNOSIS — Z79899 Other long term (current) drug therapy: Secondary | ICD-10-CM | POA: Diagnosis not present

## 2022-12-13 DIAGNOSIS — D518 Other vitamin B12 deficiency anemias: Secondary | ICD-10-CM | POA: Diagnosis not present

## 2022-12-13 DIAGNOSIS — E559 Vitamin D deficiency, unspecified: Secondary | ICD-10-CM | POA: Diagnosis not present

## 2022-12-13 DIAGNOSIS — E119 Type 2 diabetes mellitus without complications: Secondary | ICD-10-CM | POA: Diagnosis not present

## 2022-12-22 DIAGNOSIS — F0390 Unspecified dementia without behavioral disturbance: Secondary | ICD-10-CM | POA: Diagnosis not present

## 2022-12-22 DIAGNOSIS — D529 Folate deficiency anemia, unspecified: Secondary | ICD-10-CM | POA: Diagnosis not present

## 2022-12-22 DIAGNOSIS — R799 Abnormal finding of blood chemistry, unspecified: Secondary | ICD-10-CM | POA: Diagnosis not present

## 2023-01-03 ENCOUNTER — Ambulatory Visit: Payer: Medicare Other | Admitting: Internal Medicine

## 2023-01-03 ENCOUNTER — Encounter: Payer: Self-pay | Admitting: Internal Medicine

## 2023-01-03 VITALS — BP 120/60 | HR 74 | Temp 97.5°F | Resp 16 | Ht 76.0 in | Wt 236.0 lb

## 2023-01-03 DIAGNOSIS — J302 Other seasonal allergic rhinitis: Secondary | ICD-10-CM

## 2023-01-03 MED ORDER — FLUTICASONE PROPIONATE 50 MCG/ACT NA SUSP: 1.0000 | Freq: Two times a day (BID) | NASAL | 2 refills | Status: AC

## 2023-01-03 MED ORDER — CETIRIZINE HCL 10 MG PO TABS: 10.0000 mg | ORAL_TABLET | Freq: Every day | ORAL | 5 refills | Status: AC

## 2023-01-03 NOTE — Assessment & Plan Note (Signed)
He has seasonal allergies.  He had a sinus infection and states the flonase he was given helped control his symptoms.  We will star him back on flonase and add zyrtec to his regimen.

## 2023-01-03 NOTE — Progress Notes (Signed)
Office Visit  Subjective   Patient ID: David Casey   DOB: 11-May-1941   Age: 82 y.o.   MRN: [id number]   Chief Complaint Chief Complaint  Patient presents with   Follow-up    Diabetes     History of Present Illness David Casey comes in today with complaints of runny nose with clear nasal discharge for about 2 weeks.  He recently from [employer] to [facility] where he states he was on flonase which controlled his symptoms.  He is having sinus congestion with post nasal drip with cough productive of unknown color.  There is no fevers, chills, chest congestion, SOB, wheezing, nausea, vomiting, diarrhea, myaglias, headaches or other problems.  He is not on any H2 blockers.       Past Medical History Past Medical History:  Diagnosis Date   Atrial fibrillation ([place])    Benign prostatic hypertrophy    CKD (chronic kidney disease), stage III (HCC)    Depression    Dysphagia    Essential tremor    History of surgery of head    Hyperglycemia    Hyperlipemia    Hypertension    Memory loss    Meningioma (HCC)    Prediabetes    Seizures (HCC)    Vision abnormalities    Vitamin B12 deficiency    Vitamin D deficiency      Allergies No Known Allergies   Medications  Current Outpatient Medications:    cetirizine (ZYRTEC) 10 MG tablet, Take 1 tablet (10 mg total) by mouth daily., Disp: 30 tablet, Rfl: 5   fluticasone (FLONASE) 50 MCG/ACT nasal spray, Place 1 spray into both nostrils in the morning and at bedtime., Disp: 15.8 mL, Rfl: 2   amLODipine (NORVASC) 5 MG tablet, TAKE 1 TABLET DAILY., Disp: 90 tablet, Rfl: 0   bethanechol (URECHOLINE) 25 MG tablet, Take 25 mg by mouth 2 (two) times daily., Disp: , Rfl:    Calcium-Magnesium-Vitamin D (CALCIUM 500 PO), Take 1,250 mg by mouth daily., Disp: , Rfl:    cyanocobalamin 2000 MCG tablet, Take 2,000 mcg by mouth 2 (two) times daily., Disp: , Rfl:    ELIQUIS 5 MG TABS tablet, TAKE 1 TABLET BY MOUTH TWICE  DAILY., Disp: 180 tablet, Rfl: 0   escitalopram (LEXAPRO) 20 MG tablet, Take 20 mg by mouth daily., Disp: , Rfl:    famotidine (PEPCID) 20 MG tablet, Take 20 mg by mouth daily., Disp: , Rfl:    levETIRAcetam (KEPPRA) 750 MG tablet, Take 750 mg by mouth every 6 (six) hours., Disp: , Rfl:    metFORMIN (GLUCOPHAGE) 500 MG tablet, Take by mouth 2 (two) times daily with a meal. For 30 days., Disp: , Rfl:    pravastatin (PRAVACHOL) 40 MG tablet, Take 40 mg by mouth at bedtime., Disp: , Rfl:    tamsulosin (FLOMAX) 0.4 MG CAPS capsule, Take 0.4 mg by mouth. Take 2 caps one daily 1/2 hour following the same meal each day., Disp: , Rfl:    Vitamin D, Ergocalciferol, (DRISDOL) 50000 UNITS CAPS capsule, Take 50,000 Units by mouth every 7 (seven) days., Disp: , Rfl:    Review of Systems Review of Systems  Constitutional:  Negative for chills and fever.  HENT:  Positive for congestion. Negative for sinus pain and sore throat.   Respiratory:  Positive for cough. Negative for sputum production, shortness of breath, wheezing and stridor.   Cardiovascular:  Negative for chest pain, palpitations and leg swelling.  Gastrointestinal:  Negative for abdominal pain, constipation, diarrhea, nausea and vomiting.  Musculoskeletal:  Negative for myalgias.  Neurological:  Negative for dizziness and headaches.       Objective:    Vitals BP 120/60   Pulse 74   Temp (!) 97.5 F (36.4 C)   Resp 16   Ht 6\' 4"  (1.93 m)   Wt 236 lb (107 kg)   SpO2 97%   BMI 28.73 kg/m    Physical Examination Physical Exam Constitutional:      Appearance: Normal appearance. He is not ill-appearing.  HENT:     Right Ear: Tympanic membrane, ear canal and external ear normal.     Left Ear: Tympanic membrane, ear canal and external ear normal.     Nose: Congestion and rhinorrhea present.     Mouth/Throat:     Mouth: Mucous membranes are moist.     Pharynx: Oropharynx is clear. No oropharyngeal exudate or posterior oropharyngeal  erythema.  Cardiovascular:     Rate and Rhythm: Normal rate and regular rhythm.     Pulses: Normal pulses.     Heart sounds: No murmur heard.    No friction rub. No gallop.  Pulmonary:     Effort: Pulmonary effort is normal. No respiratory distress.     Breath sounds: No wheezing, rhonchi or rales.  Abdominal:     General: Abdomen is flat. Bowel sounds are normal. There is no distension.     Palpations: Abdomen is soft.     Tenderness: There is no abdominal tenderness.  Musculoskeletal:     Right lower leg: No edema.     Left lower leg: No edema.  Skin:    General: Skin is warm and dry.     Findings: No rash.  Neurological:     Mental Status: He is alert.        Assessment & Plan:   Seasonal allergic rhinitis He has seasonal allergies.  He had a sinus infection and states the flonase he was given helped control his symptoms.  We will star him back on flonase and add zyrtec to his regimen.    No follow-ups on file.   Townsend Roger, MD

## 2023-01-14 DIAGNOSIS — N390 Urinary tract infection, site not specified: Secondary | ICD-10-CM | POA: Diagnosis not present

## 2023-01-19 DIAGNOSIS — B952 Enterococcus as the cause of diseases classified elsewhere: Secondary | ICD-10-CM | POA: Diagnosis not present

## 2023-01-19 DIAGNOSIS — R634 Abnormal weight loss: Secondary | ICD-10-CM | POA: Diagnosis not present

## 2023-01-19 DIAGNOSIS — R4182 Altered mental status, unspecified: Secondary | ICD-10-CM | POA: Diagnosis not present

## 2023-01-19 DIAGNOSIS — N390 Urinary tract infection, site not specified: Secondary | ICD-10-CM | POA: Diagnosis not present

## 2023-01-26 ENCOUNTER — Telehealth: Payer: Self-pay

## 2023-01-26 NOTE — Patient Outreach (Signed)
  Care Coordination   Initial Visit Note   01/26/2023 Name: David Casey MRN: [id number] DOB: 24-May-1941  David Casey is a 82 y.o. year old male who sees David Fat, MD for primary care. I spoke with  David Casey by phone today.  What matters to the patients health and wellness today?  Placed call to patient to review and offer [facility] services. I was informed by a staff member that patient is in long term care.      SDOH assessments and interventions completed:  No     Care Coordination Interventions:  No, not indicated   Follow up plan: No further intervention required.   Encounter Outcome:  Pt. Refused   David Pavy, RN, BSN, CEN [facility] [phone]

## 2023-02-02 DIAGNOSIS — R569 Unspecified convulsions: Secondary | ICD-10-CM | POA: Diagnosis not present

## 2023-02-02 DIAGNOSIS — R269 Unspecified abnormalities of gait and mobility: Secondary | ICD-10-CM | POA: Diagnosis not present

## 2023-02-02 DIAGNOSIS — G250 Essential tremor: Secondary | ICD-10-CM | POA: Diagnosis not present

## 2023-02-02 DIAGNOSIS — H5310 Unspecified subjective visual disturbances: Secondary | ICD-10-CM | POA: Diagnosis not present

## 2023-02-02 DIAGNOSIS — M6281 Muscle weakness (generalized): Secondary | ICD-10-CM | POA: Diagnosis not present

## 2023-02-02 DIAGNOSIS — D329 Benign neoplasm of meninges, unspecified: Secondary | ICD-10-CM | POA: Diagnosis not present

## 2023-02-02 DIAGNOSIS — F0390 Unspecified dementia without behavioral disturbance: Secondary | ICD-10-CM

## 2023-02-07 DIAGNOSIS — F0390 Unspecified dementia without behavioral disturbance: Secondary | ICD-10-CM | POA: Diagnosis not present

## 2023-02-07 DIAGNOSIS — G4089 Other seizures: Secondary | ICD-10-CM | POA: Diagnosis not present

## 2023-02-07 DIAGNOSIS — H5310 Unspecified subjective visual disturbances: Secondary | ICD-10-CM | POA: Diagnosis not present

## 2023-02-07 DIAGNOSIS — G250 Essential tremor: Secondary | ICD-10-CM | POA: Diagnosis not present

## 2023-02-14 DIAGNOSIS — Z79899 Other long term (current) drug therapy: Secondary | ICD-10-CM | POA: Diagnosis not present

## 2023-02-14 DIAGNOSIS — E038 Other specified hypothyroidism: Secondary | ICD-10-CM | POA: Diagnosis not present

## 2023-02-20 ENCOUNTER — Ambulatory Visit: Payer: Medicare Other | Admitting: Internal Medicine

## 2023-02-20 ENCOUNTER — Encounter: Payer: Self-pay | Admitting: Internal Medicine

## 2023-02-20 VITALS — BP 130/78 | HR 66 | Temp 97.6°F | Resp 16 | Ht 76.0 in | Wt 242.6 lb

## 2023-02-20 DIAGNOSIS — Z7901 Long term (current) use of anticoagulants: Secondary | ICD-10-CM | POA: Diagnosis not present

## 2023-02-20 DIAGNOSIS — R269 Unspecified abnormalities of gait and mobility: Secondary | ICD-10-CM | POA: Diagnosis not present

## 2023-02-20 DIAGNOSIS — N401 Enlarged prostate with lower urinary tract symptoms: Secondary | ICD-10-CM | POA: Insufficient documentation

## 2023-02-20 DIAGNOSIS — Z7984 Long term (current) use of oral hypoglycemic drugs: Secondary | ICD-10-CM | POA: Diagnosis not present

## 2023-02-20 DIAGNOSIS — M6281 Muscle weakness (generalized): Secondary | ICD-10-CM | POA: Diagnosis not present

## 2023-02-20 DIAGNOSIS — D329 Benign neoplasm of meninges, unspecified: Secondary | ICD-10-CM | POA: Diagnosis not present

## 2023-02-20 DIAGNOSIS — H5310 Unspecified subjective visual disturbances: Secondary | ICD-10-CM | POA: Diagnosis not present

## 2023-02-20 DIAGNOSIS — R7303 Prediabetes: Secondary | ICD-10-CM | POA: Diagnosis not present

## 2023-02-20 DIAGNOSIS — G250 Essential tremor: Secondary | ICD-10-CM | POA: Diagnosis not present

## 2023-02-20 LAB — POCT URINALYSIS DIPSTICK
Bilirubin, UA: NEGATIVE
Blood, UA: NEGATIVE
Glucose, UA: POSITIVE — AB
Ketones, UA: NEGATIVE
Leukocytes, UA: NEGATIVE
Nitrite, UA: NEGATIVE
Protein, UA: NEGATIVE
Spec Grav, UA: 1.015 (ref 1.010–1.025)
Urobilinogen, UA: 0.2 E.U./dL
pH, UA: 5.5 (ref 5.0–8.0)

## 2023-02-20 LAB — HEMOGLOBIN A1C
Est. average glucose Bld gHb Est-mCnc: 140 mg/dL
Hgb A1c MFr Bld: 6.5 % — ABNORMAL HIGH (ref 4.8–5.6)

## 2023-02-20 MED ORDER — FINASTERIDE 5 MG PO TABS: 5.0000 mg | ORAL_TABLET | Freq: Every day | ORAL | 5 refills | Status: AC

## 2023-02-20 NOTE — Assessment & Plan Note (Signed)
I am going to add proscar to his regimen at this time.  We did a UA and this did not show a UTI.  We will refer him to urology.

## 2023-02-20 NOTE — Assessment & Plan Note (Signed)
His UA shows 500mg /dL of glucose.  We have not checked a HgBA1c on him since last year and we will check it to make sure he has not crossed over to diabetes.

## 2023-02-20 NOTE — Addendum Note (Signed)
Addended byOda Cogan on: 02/20/2023 02:54 PM   Modules accepted: Orders

## 2023-02-20 NOTE — Progress Notes (Signed)
Office Visit  Subjective   Patient ID: Doctor Cobas Magouirk   DOB: 01/26/1941   Age: 82 y.o.   MRN: [id number]   Chief Complaint Chief Complaint  Patient presents with   Acute Visit    Possible UTI     History of Present Illness David Casey comes in today with complaints of urinary frequency that started "a long time".  He is here for evaluation of UTI.  He states he is getting up 3 times at night to urinate.  There is no complaints of dysuria, fevers, chills, abd/flank/back pain, urinary hestiancy or hematuria.       Past Medical History Past Medical History:  Diagnosis Date   Atrial fibrillation (HCC)    Benign prostatic hypertrophy    CKD (chronic kidney disease), stage III (HCC)    Depression    Dysphagia    Essential tremor    History of surgery of head    Hyperglycemia    Hyperlipemia    Hypertension    Memory loss    Meningioma (HCC)    Prediabetes    Seizures (HCC)    Vision abnormalities    Vitamin B12 deficiency    Vitamin D deficiency      Allergies No Known Allergies   Medications  Current Outpatient Medications:    amLODipine (NORVASC) 5 MG tablet, TAKE 1 TABLET DAILY., Disp: 90 tablet, Rfl: 0   bethanechol (URECHOLINE) 25 MG tablet, Take 25 mg by mouth 2 (two) times daily., Disp: , Rfl:    Calcium-Magnesium-Vitamin D (CALCIUM 500 PO), Take 1,250 mg by mouth daily., Disp: , Rfl:    cetirizine (ZYRTEC) 10 MG tablet, Take 1 tablet (10 mg total) by mouth daily., Disp: 30 tablet, Rfl: 5   cyanocobalamin 2000 MCG tablet, Take 2,000 mcg by mouth 2 (two) times daily., Disp: , Rfl:    ELIQUIS 5 MG TABS tablet, TAKE 1 TABLET BY MOUTH TWICE DAILY., Disp: 180 tablet, Rfl: 0   escitalopram (LEXAPRO) 20 MG tablet, Take 20 mg by mouth daily., Disp: , Rfl:    famotidine (PEPCID) 20 MG tablet, Take 20 mg by mouth daily., Disp: , Rfl:    fluticasone (FLONASE) 50 MCG/ACT nasal spray, Place 1 spray into both nostrils in the morning and at bedtime., Disp: 15.8 mL, Rfl: 2    levETIRAcetam (KEPPRA) 750 MG tablet, Take 750 mg by mouth every 6 (six) hours., Disp: , Rfl:    metFORMIN (GLUCOPHAGE) 500 MG tablet, Take by mouth 2 (two) times daily with a meal. For 30 days., Disp: , Rfl:    pravastatin (PRAVACHOL) 40 MG tablet, Take 40 mg by mouth at bedtime., Disp: , Rfl:    tamsulosin (FLOMAX) 0.4 MG CAPS capsule, Take 0.4 mg by mouth. Take 2 caps one daily 1/2 hour following the same meal each day., Disp: , Rfl:    Vitamin D, Ergocalciferol, (DRISDOL) 50000 UNITS CAPS capsule, Take 50,000 Units by mouth every 7 (seven) days., Disp: , Rfl:    Review of Systems Review of Systems  Constitutional:  Negative for chills and fever.  Respiratory:  Negative for shortness of breath.   Cardiovascular:  Negative for chest pain, palpitations and leg swelling.  Gastrointestinal:  Negative for abdominal pain, constipation, diarrhea, nausea and vomiting.  Genitourinary:  Positive for frequency and urgency. Negative for dysuria, flank pain and hematuria.  Neurological:  Negative for dizziness and weakness.       Objective:    Vitals BP 130/78   Pulse 66  Temp 97.6 F (36.4 C)   Resp 16   Ht 6\' 4"  (1.93 m)   Wt 242 lb 9.6 oz (110 kg)   BMI 29.53 kg/m    Physical Examination Physical Exam Constitutional:      Appearance: Normal appearance. He is not ill-appearing.  Cardiovascular:     Rate and Rhythm: Normal rate and regular rhythm.     Pulses: Normal pulses.     Heart sounds: No murmur heard.    No friction rub. No gallop.  Pulmonary:     Effort: Pulmonary effort is normal. No respiratory distress.     Breath sounds: No wheezing, rhonchi or rales.  Abdominal:     General: Abdomen is flat. Bowel sounds are normal. There is no distension.     Palpations: Abdomen is soft.     Tenderness: There is no abdominal tenderness.  Musculoskeletal:     Right lower leg: No edema.     Left lower leg: No edema.  Skin:    General: Skin is warm and dry.     Findings: No rash.   Neurological:     Mental Status: He is alert.        Assessment & Plan:   Benign prostatic hyperplasia with lower urinary tract symptoms I am going to add proscar to his regimen at this time.  We did a UA and this did not show a UTI.  We will refer him to urology.  Prediabetes His UA shows 500mg /dL of glucose.  We have not checked a HgBA1c on him since last year and we will check it to make sure he has not crossed over to diabetes.    No follow-ups on file.   Crist Fat, MD

## 2023-02-23 DIAGNOSIS — D649 Anemia, unspecified: Secondary | ICD-10-CM | POA: Diagnosis not present

## 2023-02-23 DIAGNOSIS — I4891 Unspecified atrial fibrillation: Secondary | ICD-10-CM | POA: Diagnosis not present

## 2023-02-23 DIAGNOSIS — F0390 Unspecified dementia without behavioral disturbance: Secondary | ICD-10-CM | POA: Diagnosis not present

## 2023-03-01 DIAGNOSIS — Z7901 Long term (current) use of anticoagulants: Secondary | ICD-10-CM | POA: Diagnosis not present

## 2023-03-01 DIAGNOSIS — G250 Essential tremor: Secondary | ICD-10-CM | POA: Diagnosis not present

## 2023-03-01 DIAGNOSIS — D329 Benign neoplasm of meninges, unspecified: Secondary | ICD-10-CM | POA: Diagnosis not present

## 2023-03-01 DIAGNOSIS — H5310 Unspecified subjective visual disturbances: Secondary | ICD-10-CM | POA: Diagnosis not present

## 2023-03-01 DIAGNOSIS — M6281 Muscle weakness (generalized): Secondary | ICD-10-CM | POA: Diagnosis not present

## 2023-03-01 DIAGNOSIS — Z7984 Long term (current) use of oral hypoglycemic drugs: Secondary | ICD-10-CM | POA: Diagnosis not present

## 2023-03-01 DIAGNOSIS — R269 Unspecified abnormalities of gait and mobility: Secondary | ICD-10-CM | POA: Diagnosis not present

## 2023-03-02 DIAGNOSIS — R339 Retention of urine, unspecified: Secondary | ICD-10-CM | POA: Diagnosis not present

## 2023-03-02 DIAGNOSIS — Z79899 Other long term (current) drug therapy: Secondary | ICD-10-CM | POA: Diagnosis not present

## 2023-03-02 DIAGNOSIS — R609 Edema, unspecified: Secondary | ICD-10-CM | POA: Diagnosis not present

## 2023-03-02 DIAGNOSIS — R3915 Urgency of urination: Secondary | ICD-10-CM | POA: Diagnosis not present

## 2023-03-02 DIAGNOSIS — R350 Frequency of micturition: Secondary | ICD-10-CM | POA: Diagnosis not present

## 2023-03-08 DIAGNOSIS — N390 Urinary tract infection, site not specified: Secondary | ICD-10-CM | POA: Diagnosis not present

## 2023-03-09 DIAGNOSIS — N401 Enlarged prostate with lower urinary tract symptoms: Secondary | ICD-10-CM | POA: Diagnosis not present

## 2023-03-09 DIAGNOSIS — R4182 Altered mental status, unspecified: Secondary | ICD-10-CM | POA: Diagnosis not present

## 2023-03-15 DIAGNOSIS — G250 Essential tremor: Secondary | ICD-10-CM | POA: Diagnosis not present

## 2023-03-15 DIAGNOSIS — R269 Unspecified abnormalities of gait and mobility: Secondary | ICD-10-CM | POA: Diagnosis not present

## 2023-03-15 DIAGNOSIS — M6281 Muscle weakness (generalized): Secondary | ICD-10-CM | POA: Diagnosis not present

## 2023-03-15 DIAGNOSIS — H5310 Unspecified subjective visual disturbances: Secondary | ICD-10-CM | POA: Diagnosis not present

## 2023-03-15 DIAGNOSIS — D329 Benign neoplasm of meninges, unspecified: Secondary | ICD-10-CM | POA: Diagnosis not present

## 2023-03-15 DIAGNOSIS — Z7984 Long term (current) use of oral hypoglycemic drugs: Secondary | ICD-10-CM | POA: Diagnosis not present

## 2023-03-15 DIAGNOSIS — Z7901 Long term (current) use of anticoagulants: Secondary | ICD-10-CM | POA: Diagnosis not present

## 2023-03-16 DIAGNOSIS — N390 Urinary tract infection, site not specified: Secondary | ICD-10-CM | POA: Diagnosis not present

## 2023-03-16 DIAGNOSIS — Z87440 Personal history of urinary (tract) infections: Secondary | ICD-10-CM | POA: Diagnosis not present

## 2023-03-16 DIAGNOSIS — F0390 Unspecified dementia without behavioral disturbance: Secondary | ICD-10-CM

## 2023-03-16 DIAGNOSIS — B952 Enterococcus as the cause of diseases classified elsewhere: Secondary | ICD-10-CM | POA: Diagnosis not present

## 2023-03-23 ENCOUNTER — Ambulatory Visit: Payer: Medicare Other | Admitting: Podiatry

## 2023-03-23 DIAGNOSIS — R3915 Urgency of urination: Secondary | ICD-10-CM | POA: Diagnosis not present

## 2023-03-23 DIAGNOSIS — N400 Enlarged prostate without lower urinary tract symptoms: Secondary | ICD-10-CM

## 2023-03-23 DIAGNOSIS — R600 Localized edema: Secondary | ICD-10-CM | POA: Diagnosis not present

## 2023-03-23 DIAGNOSIS — I129 Hypertensive chronic kidney disease with stage 1 through stage 4 chronic kidney disease, or unspecified chronic kidney disease: Secondary | ICD-10-CM | POA: Diagnosis not present

## 2023-03-23 DIAGNOSIS — F0390 Unspecified dementia without behavioral disturbance: Secondary | ICD-10-CM

## 2023-03-23 DIAGNOSIS — N1831 Chronic kidney disease, stage 3a: Secondary | ICD-10-CM | POA: Diagnosis not present

## 2023-03-23 DIAGNOSIS — E1122 Type 2 diabetes mellitus with diabetic chronic kidney disease: Secondary | ICD-10-CM | POA: Diagnosis not present

## 2023-03-23 DIAGNOSIS — R339 Retention of urine, unspecified: Secondary | ICD-10-CM | POA: Diagnosis not present

## 2023-03-28 ENCOUNTER — Ambulatory Visit (INDEPENDENT_AMBULATORY_CARE_PROVIDER_SITE_OTHER): Payer: Medicare Other | Admitting: Podiatry

## 2023-03-28 DIAGNOSIS — Z5321 Procedure and treatment not carried out due to patient leaving prior to being seen by health care provider: Secondary | ICD-10-CM

## 2023-03-28 NOTE — Progress Notes (Signed)
Pt left without being seen, says he does his own nails

## 2023-03-30 DIAGNOSIS — E1165 Type 2 diabetes mellitus with hyperglycemia: Secondary | ICD-10-CM | POA: Diagnosis not present

## 2023-03-30 DIAGNOSIS — I10 Essential (primary) hypertension: Secondary | ICD-10-CM | POA: Diagnosis not present

## 2023-03-30 DIAGNOSIS — F0390 Unspecified dementia without behavioral disturbance: Secondary | ICD-10-CM | POA: Diagnosis not present

## 2023-04-06 DIAGNOSIS — Z9181 History of falling: Secondary | ICD-10-CM | POA: Diagnosis not present

## 2023-04-06 DIAGNOSIS — N390 Urinary tract infection, site not specified: Secondary | ICD-10-CM | POA: Diagnosis not present

## 2023-04-06 DIAGNOSIS — B961 Klebsiella pneumoniae [K. pneumoniae] as the cause of diseases classified elsewhere: Secondary | ICD-10-CM | POA: Diagnosis not present

## 2023-04-06 DIAGNOSIS — F0390 Unspecified dementia without behavioral disturbance: Secondary | ICD-10-CM

## 2023-04-06 DIAGNOSIS — Z87440 Personal history of urinary (tract) infections: Secondary | ICD-10-CM | POA: Diagnosis not present

## 2023-04-06 DIAGNOSIS — B9620 Unspecified Escherichia coli [E. coli] as the cause of diseases classified elsewhere: Secondary | ICD-10-CM | POA: Diagnosis not present

## 2023-04-06 DIAGNOSIS — E1165 Type 2 diabetes mellitus with hyperglycemia: Secondary | ICD-10-CM | POA: Diagnosis not present

## 2023-04-17 DIAGNOSIS — R339 Retention of urine, unspecified: Secondary | ICD-10-CM | POA: Diagnosis not present

## 2023-04-17 DIAGNOSIS — R609 Edema, unspecified: Secondary | ICD-10-CM | POA: Diagnosis not present

## 2023-04-17 DIAGNOSIS — R350 Frequency of micturition: Secondary | ICD-10-CM | POA: Diagnosis not present

## 2023-05-18 DIAGNOSIS — F0390 Unspecified dementia without behavioral disturbance: Secondary | ICD-10-CM

## 2023-05-18 DIAGNOSIS — E1165 Type 2 diabetes mellitus with hyperglycemia: Secondary | ICD-10-CM

## 2023-05-18 DIAGNOSIS — N1832 Chronic kidney disease, stage 3b: Secondary | ICD-10-CM

## 2023-05-18 DIAGNOSIS — E1122 Type 2 diabetes mellitus with diabetic chronic kidney disease: Secondary | ICD-10-CM

## 2023-06-15 DIAGNOSIS — I129 Hypertensive chronic kidney disease with stage 1 through stage 4 chronic kidney disease, or unspecified chronic kidney disease: Secondary | ICD-10-CM | POA: Diagnosis not present

## 2023-06-15 DIAGNOSIS — I4891 Unspecified atrial fibrillation: Secondary | ICD-10-CM | POA: Diagnosis not present

## 2023-06-15 DIAGNOSIS — F0390 Unspecified dementia without behavioral disturbance: Secondary | ICD-10-CM

## 2023-06-15 DIAGNOSIS — E1122 Type 2 diabetes mellitus with diabetic chronic kidney disease: Secondary | ICD-10-CM | POA: Diagnosis not present

## 2023-06-15 DIAGNOSIS — N1831 Chronic kidney disease, stage 3a: Secondary | ICD-10-CM | POA: Diagnosis not present

## 2023-06-15 DIAGNOSIS — E785 Hyperlipidemia, unspecified: Secondary | ICD-10-CM | POA: Diagnosis not present

## 2023-06-15 DIAGNOSIS — R569 Unspecified convulsions: Secondary | ICD-10-CM | POA: Diagnosis not present

## 2023-06-22 DIAGNOSIS — N1831 Chronic kidney disease, stage 3a: Secondary | ICD-10-CM | POA: Diagnosis not present

## 2023-06-22 DIAGNOSIS — Z79899 Other long term (current) drug therapy: Secondary | ICD-10-CM | POA: Diagnosis not present

## 2023-06-22 DIAGNOSIS — F330 Major depressive disorder, recurrent, mild: Secondary | ICD-10-CM

## 2023-06-22 DIAGNOSIS — F03918 Unspecified dementia, unspecified severity, with other behavioral disturbance: Secondary | ICD-10-CM | POA: Diagnosis not present

## 2023-06-22 DIAGNOSIS — R269 Unspecified abnormalities of gait and mobility: Secondary | ICD-10-CM | POA: Diagnosis not present

## 2023-06-22 DIAGNOSIS — R569 Unspecified convulsions: Secondary | ICD-10-CM | POA: Diagnosis not present

## 2023-06-22 DIAGNOSIS — R451 Restlessness and agitation: Secondary | ICD-10-CM | POA: Diagnosis not present

## 2023-06-22 DIAGNOSIS — M6281 Muscle weakness (generalized): Secondary | ICD-10-CM | POA: Diagnosis not present

## 2023-06-22 DIAGNOSIS — I129 Hypertensive chronic kidney disease with stage 1 through stage 4 chronic kidney disease, or unspecified chronic kidney disease: Secondary | ICD-10-CM | POA: Diagnosis not present

## 2023-06-27 DIAGNOSIS — Z79899 Other long term (current) drug therapy: Secondary | ICD-10-CM | POA: Diagnosis not present

## 2023-06-27 DIAGNOSIS — D518 Other vitamin B12 deficiency anemias: Secondary | ICD-10-CM | POA: Diagnosis not present

## 2023-06-27 DIAGNOSIS — E119 Type 2 diabetes mellitus without complications: Secondary | ICD-10-CM | POA: Diagnosis not present

## 2023-06-27 DIAGNOSIS — E782 Mixed hyperlipidemia: Secondary | ICD-10-CM | POA: Diagnosis not present

## 2023-07-10 ENCOUNTER — Encounter: Payer: Medicare Other | Admitting: Internal Medicine

## 2023-07-20 DIAGNOSIS — Z9181 History of falling: Secondary | ICD-10-CM | POA: Diagnosis not present

## 2023-07-20 DIAGNOSIS — M6281 Muscle weakness (generalized): Secondary | ICD-10-CM | POA: Diagnosis not present

## 2023-07-20 DIAGNOSIS — R2689 Other abnormalities of gait and mobility: Secondary | ICD-10-CM | POA: Diagnosis not present

## 2023-07-20 DIAGNOSIS — F0390 Unspecified dementia without behavioral disturbance: Secondary | ICD-10-CM | POA: Diagnosis not present

## 2023-07-20 DIAGNOSIS — W19XXXA Unspecified fall, initial encounter: Secondary | ICD-10-CM | POA: Diagnosis not present

## 2023-08-11 DIAGNOSIS — N390 Urinary tract infection, site not specified: Secondary | ICD-10-CM | POA: Diagnosis not present

## 2023-08-11 DIAGNOSIS — N400 Enlarged prostate without lower urinary tract symptoms: Secondary | ICD-10-CM | POA: Diagnosis not present

## 2023-08-11 DIAGNOSIS — F0390 Unspecified dementia without behavioral disturbance: Secondary | ICD-10-CM | POA: Diagnosis not present

## 2023-08-11 DIAGNOSIS — Z87440 Personal history of urinary (tract) infections: Secondary | ICD-10-CM | POA: Diagnosis not present

## 2023-08-17 DIAGNOSIS — R52 Pain, unspecified: Secondary | ICD-10-CM | POA: Diagnosis not present

## 2023-08-17 DIAGNOSIS — H6123 Impacted cerumen, bilateral: Secondary | ICD-10-CM | POA: Diagnosis not present

## 2023-08-17 DIAGNOSIS — F0390 Unspecified dementia without behavioral disturbance: Secondary | ICD-10-CM | POA: Diagnosis not present

## 2023-08-31 DIAGNOSIS — G309 Alzheimer's disease, unspecified: Secondary | ICD-10-CM

## 2023-08-31 DIAGNOSIS — S51011D Laceration without foreign body of right elbow, subsequent encounter: Secondary | ICD-10-CM | POA: Diagnosis not present

## 2023-08-31 DIAGNOSIS — I4891 Unspecified atrial fibrillation: Secondary | ICD-10-CM | POA: Diagnosis not present

## 2023-08-31 DIAGNOSIS — S41111D Laceration without foreign body of right upper arm, subsequent encounter: Secondary | ICD-10-CM | POA: Diagnosis not present

## 2023-08-31 DIAGNOSIS — R2689 Other abnormalities of gait and mobility: Secondary | ICD-10-CM

## 2023-08-31 DIAGNOSIS — E119 Type 2 diabetes mellitus without complications: Secondary | ICD-10-CM | POA: Diagnosis not present

## 2023-08-31 DIAGNOSIS — S51011A Laceration without foreign body of right elbow, initial encounter: Secondary | ICD-10-CM

## 2023-08-31 DIAGNOSIS — W19XXXA Unspecified fall, initial encounter: Secondary | ICD-10-CM

## 2023-08-31 DIAGNOSIS — M6281 Muscle weakness (generalized): Secondary | ICD-10-CM

## 2023-09-04 DIAGNOSIS — S41111D Laceration without foreign body of right upper arm, subsequent encounter: Secondary | ICD-10-CM | POA: Diagnosis not present

## 2023-09-04 DIAGNOSIS — S51011D Laceration without foreign body of right elbow, subsequent encounter: Secondary | ICD-10-CM | POA: Diagnosis not present

## 2023-09-04 DIAGNOSIS — E119 Type 2 diabetes mellitus without complications: Secondary | ICD-10-CM | POA: Diagnosis not present

## 2023-09-04 DIAGNOSIS — I4891 Unspecified atrial fibrillation: Secondary | ICD-10-CM | POA: Diagnosis not present

## 2023-09-07 DIAGNOSIS — S51011D Laceration without foreign body of right elbow, subsequent encounter: Secondary | ICD-10-CM | POA: Diagnosis not present

## 2023-09-07 DIAGNOSIS — I4891 Unspecified atrial fibrillation: Secondary | ICD-10-CM | POA: Diagnosis not present

## 2023-09-07 DIAGNOSIS — S41111D Laceration without foreign body of right upper arm, subsequent encounter: Secondary | ICD-10-CM | POA: Diagnosis not present

## 2023-09-07 DIAGNOSIS — E119 Type 2 diabetes mellitus without complications: Secondary | ICD-10-CM | POA: Diagnosis not present

## 2023-09-11 DIAGNOSIS — E119 Type 2 diabetes mellitus without complications: Secondary | ICD-10-CM | POA: Diagnosis not present

## 2023-09-11 DIAGNOSIS — S51011D Laceration without foreign body of right elbow, subsequent encounter: Secondary | ICD-10-CM | POA: Diagnosis not present

## 2023-09-11 DIAGNOSIS — S41111D Laceration without foreign body of right upper arm, subsequent encounter: Secondary | ICD-10-CM | POA: Diagnosis not present

## 2023-09-11 DIAGNOSIS — I4891 Unspecified atrial fibrillation: Secondary | ICD-10-CM | POA: Diagnosis not present

## 2023-09-15 DIAGNOSIS — I4891 Unspecified atrial fibrillation: Secondary | ICD-10-CM | POA: Diagnosis not present

## 2023-09-15 DIAGNOSIS — E119 Type 2 diabetes mellitus without complications: Secondary | ICD-10-CM | POA: Diagnosis not present

## 2023-09-15 DIAGNOSIS — S51011D Laceration without foreign body of right elbow, subsequent encounter: Secondary | ICD-10-CM | POA: Diagnosis not present

## 2023-09-15 DIAGNOSIS — S41111D Laceration without foreign body of right upper arm, subsequent encounter: Secondary | ICD-10-CM | POA: Diagnosis not present

## 2023-09-19 DIAGNOSIS — S51011D Laceration without foreign body of right elbow, subsequent encounter: Secondary | ICD-10-CM | POA: Diagnosis not present

## 2023-09-19 DIAGNOSIS — S41111D Laceration without foreign body of right upper arm, subsequent encounter: Secondary | ICD-10-CM | POA: Diagnosis not present

## 2023-09-19 DIAGNOSIS — E119 Type 2 diabetes mellitus without complications: Secondary | ICD-10-CM | POA: Diagnosis not present

## 2023-09-19 DIAGNOSIS — I4891 Unspecified atrial fibrillation: Secondary | ICD-10-CM | POA: Diagnosis not present

## 2023-09-21 DIAGNOSIS — F0390 Unspecified dementia without behavioral disturbance: Secondary | ICD-10-CM | POA: Diagnosis not present

## 2023-09-21 DIAGNOSIS — R21 Rash and other nonspecific skin eruption: Secondary | ICD-10-CM | POA: Diagnosis not present

## 2023-09-25 DIAGNOSIS — I4891 Unspecified atrial fibrillation: Secondary | ICD-10-CM | POA: Diagnosis not present

## 2023-09-25 DIAGNOSIS — S51011D Laceration without foreign body of right elbow, subsequent encounter: Secondary | ICD-10-CM | POA: Diagnosis not present

## 2023-09-25 DIAGNOSIS — S41111D Laceration without foreign body of right upper arm, subsequent encounter: Secondary | ICD-10-CM | POA: Diagnosis not present

## 2023-09-25 DIAGNOSIS — E119 Type 2 diabetes mellitus without complications: Secondary | ICD-10-CM | POA: Diagnosis not present

## 2023-10-05 DIAGNOSIS — I4891 Unspecified atrial fibrillation: Secondary | ICD-10-CM | POA: Diagnosis not present

## 2023-10-05 DIAGNOSIS — E559 Vitamin D deficiency, unspecified: Secondary | ICD-10-CM | POA: Diagnosis not present

## 2023-10-05 DIAGNOSIS — E538 Deficiency of other specified B group vitamins: Secondary | ICD-10-CM | POA: Diagnosis not present

## 2023-10-05 DIAGNOSIS — I129 Hypertensive chronic kidney disease with stage 1 through stage 4 chronic kidney disease, or unspecified chronic kidney disease: Secondary | ICD-10-CM | POA: Diagnosis not present

## 2023-10-05 DIAGNOSIS — F0390 Unspecified dementia without behavioral disturbance: Secondary | ICD-10-CM

## 2023-10-05 DIAGNOSIS — R7303 Prediabetes: Secondary | ICD-10-CM | POA: Diagnosis not present

## 2023-10-05 DIAGNOSIS — R569 Unspecified convulsions: Secondary | ICD-10-CM | POA: Diagnosis not present

## 2023-10-05 DIAGNOSIS — N183 Chronic kidney disease, stage 3 unspecified: Secondary | ICD-10-CM | POA: Diagnosis not present

## 2023-10-09 DIAGNOSIS — S51011D Laceration without foreign body of right elbow, subsequent encounter: Secondary | ICD-10-CM | POA: Diagnosis not present

## 2023-10-09 DIAGNOSIS — I4891 Unspecified atrial fibrillation: Secondary | ICD-10-CM | POA: Diagnosis not present

## 2023-10-09 DIAGNOSIS — E119 Type 2 diabetes mellitus without complications: Secondary | ICD-10-CM | POA: Diagnosis not present

## 2023-10-09 DIAGNOSIS — S41111D Laceration without foreign body of right upper arm, subsequent encounter: Secondary | ICD-10-CM | POA: Diagnosis not present

## 2023-10-19 DIAGNOSIS — E119 Type 2 diabetes mellitus without complications: Secondary | ICD-10-CM | POA: Diagnosis not present

## 2023-10-19 DIAGNOSIS — S51011D Laceration without foreign body of right elbow, subsequent encounter: Secondary | ICD-10-CM | POA: Diagnosis not present

## 2023-10-19 DIAGNOSIS — S41111D Laceration without foreign body of right upper arm, subsequent encounter: Secondary | ICD-10-CM | POA: Diagnosis not present

## 2023-10-19 DIAGNOSIS — I4891 Unspecified atrial fibrillation: Secondary | ICD-10-CM | POA: Diagnosis not present

## 2023-10-24 DIAGNOSIS — L602 Onychogryphosis: Secondary | ICD-10-CM | POA: Diagnosis not present

## 2023-10-25 DIAGNOSIS — S41111D Laceration without foreign body of right upper arm, subsequent encounter: Secondary | ICD-10-CM | POA: Diagnosis not present

## 2023-10-25 DIAGNOSIS — S51011D Laceration without foreign body of right elbow, subsequent encounter: Secondary | ICD-10-CM | POA: Diagnosis not present

## 2023-10-25 DIAGNOSIS — E119 Type 2 diabetes mellitus without complications: Secondary | ICD-10-CM | POA: Diagnosis not present

## 2023-10-25 DIAGNOSIS — I4891 Unspecified atrial fibrillation: Secondary | ICD-10-CM | POA: Diagnosis not present

## 2023-11-16 DIAGNOSIS — H6123 Impacted cerumen, bilateral: Secondary | ICD-10-CM

## 2023-11-16 DIAGNOSIS — F0390 Unspecified dementia without behavioral disturbance: Secondary | ICD-10-CM | POA: Diagnosis not present

## 2023-11-16 DIAGNOSIS — H9193 Unspecified hearing loss, bilateral: Secondary | ICD-10-CM

## 2023-11-21 DIAGNOSIS — R051 Acute cough: Secondary | ICD-10-CM | POA: Diagnosis not present

## 2023-11-23 DIAGNOSIS — R0989 Other specified symptoms and signs involving the circulatory and respiratory systems: Secondary | ICD-10-CM | POA: Diagnosis not present

## 2023-11-23 DIAGNOSIS — R5381 Other malaise: Secondary | ICD-10-CM | POA: Diagnosis not present

## 2023-11-23 DIAGNOSIS — J069 Acute upper respiratory infection, unspecified: Secondary | ICD-10-CM | POA: Diagnosis not present

## 2023-11-30 DIAGNOSIS — F0390 Unspecified dementia without behavioral disturbance: Secondary | ICD-10-CM | POA: Diagnosis not present

## 2023-11-30 DIAGNOSIS — B974 Respiratory syncytial virus as the cause of diseases classified elsewhere: Secondary | ICD-10-CM | POA: Diagnosis not present

## 2023-11-30 DIAGNOSIS — F330 Major depressive disorder, recurrent, mild: Secondary | ICD-10-CM | POA: Diagnosis not present

## 2023-12-21 DIAGNOSIS — F0390 Unspecified dementia without behavioral disturbance: Secondary | ICD-10-CM | POA: Diagnosis not present

## 2023-12-21 DIAGNOSIS — M6281 Muscle weakness (generalized): Secondary | ICD-10-CM | POA: Diagnosis not present

## 2023-12-21 DIAGNOSIS — S50912A Unspecified superficial injury of left forearm, initial encounter: Secondary | ICD-10-CM | POA: Diagnosis not present

## 2024-01-11 DIAGNOSIS — R569 Unspecified convulsions: Secondary | ICD-10-CM

## 2024-01-11 DIAGNOSIS — N400 Enlarged prostate without lower urinary tract symptoms: Secondary | ICD-10-CM

## 2024-01-11 DIAGNOSIS — E538 Deficiency of other specified B group vitamins: Secondary | ICD-10-CM

## 2024-01-11 DIAGNOSIS — E559 Vitamin D deficiency, unspecified: Secondary | ICD-10-CM

## 2024-01-11 DIAGNOSIS — I4891 Unspecified atrial fibrillation: Secondary | ICD-10-CM

## 2024-01-11 DIAGNOSIS — E1122 Type 2 diabetes mellitus with diabetic chronic kidney disease: Secondary | ICD-10-CM

## 2024-01-11 DIAGNOSIS — E785 Hyperlipidemia, unspecified: Secondary | ICD-10-CM

## 2024-01-11 DIAGNOSIS — R1310 Dysphagia, unspecified: Secondary | ICD-10-CM

## 2024-01-11 DIAGNOSIS — F0390 Unspecified dementia without behavioral disturbance: Secondary | ICD-10-CM

## 2024-01-11 DIAGNOSIS — I129 Hypertensive chronic kidney disease with stage 1 through stage 4 chronic kidney disease, or unspecified chronic kidney disease: Secondary | ICD-10-CM

## 2024-01-11 DIAGNOSIS — N1831 Chronic kidney disease, stage 3a: Secondary | ICD-10-CM

## 2024-01-18 DIAGNOSIS — F0390 Unspecified dementia without behavioral disturbance: Secondary | ICD-10-CM | POA: Diagnosis not present

## 2024-01-18 DIAGNOSIS — N1832 Chronic kidney disease, stage 3b: Secondary | ICD-10-CM | POA: Diagnosis not present

## 2024-01-18 DIAGNOSIS — E1122 Type 2 diabetes mellitus with diabetic chronic kidney disease: Secondary | ICD-10-CM | POA: Diagnosis not present

## 2024-01-18 DIAGNOSIS — R600 Localized edema: Secondary | ICD-10-CM | POA: Diagnosis not present

## 2024-01-25 DIAGNOSIS — R5381 Other malaise: Secondary | ICD-10-CM | POA: Diagnosis not present

## 2024-01-25 DIAGNOSIS — F0390 Unspecified dementia without behavioral disturbance: Secondary | ICD-10-CM | POA: Diagnosis not present

## 2024-02-13 DIAGNOSIS — W19XXXA Unspecified fall, initial encounter: Secondary | ICD-10-CM | POA: Diagnosis not present

## 2024-02-13 DIAGNOSIS — R410 Disorientation, unspecified: Secondary | ICD-10-CM | POA: Diagnosis not present

## 2024-02-13 DIAGNOSIS — Z043 Encounter for examination and observation following other accident: Secondary | ICD-10-CM | POA: Diagnosis not present

## 2024-02-13 DIAGNOSIS — S50812A Abrasion of left forearm, initial encounter: Secondary | ICD-10-CM | POA: Diagnosis not present

## 2024-02-13 DIAGNOSIS — Z743 Need for continuous supervision: Secondary | ICD-10-CM | POA: Diagnosis not present

## 2024-02-13 DIAGNOSIS — S51812A Laceration without foreign body of left forearm, initial encounter: Secondary | ICD-10-CM | POA: Diagnosis not present

## 2024-02-13 DIAGNOSIS — T148XXA Other injury of unspecified body region, initial encounter: Secondary | ICD-10-CM | POA: Diagnosis not present

## 2024-02-15 DIAGNOSIS — R2689 Other abnormalities of gait and mobility: Secondary | ICD-10-CM | POA: Diagnosis not present

## 2024-02-15 DIAGNOSIS — M6281 Muscle weakness (generalized): Secondary | ICD-10-CM | POA: Diagnosis not present

## 2024-02-15 DIAGNOSIS — Z9181 History of falling: Secondary | ICD-10-CM | POA: Diagnosis not present

## 2024-02-15 DIAGNOSIS — S60221A Contusion of right hand, initial encounter: Secondary | ICD-10-CM | POA: Diagnosis not present

## 2024-02-15 DIAGNOSIS — S51812A Laceration without foreign body of left forearm, initial encounter: Secondary | ICD-10-CM | POA: Diagnosis not present

## 2024-02-15 DIAGNOSIS — M79641 Pain in right hand: Secondary | ICD-10-CM | POA: Diagnosis not present

## 2024-02-15 DIAGNOSIS — F0390 Unspecified dementia without behavioral disturbance: Secondary | ICD-10-CM | POA: Diagnosis not present

## 2024-02-15 DIAGNOSIS — S8001XA Contusion of right knee, initial encounter: Secondary | ICD-10-CM | POA: Diagnosis not present

## 2024-02-17 DIAGNOSIS — Z743 Need for continuous supervision: Secondary | ICD-10-CM | POA: Diagnosis not present

## 2024-02-17 DIAGNOSIS — W19XXXA Unspecified fall, initial encounter: Secondary | ICD-10-CM | POA: Diagnosis not present

## 2024-02-17 DIAGNOSIS — M7989 Other specified soft tissue disorders: Secondary | ICD-10-CM | POA: Diagnosis not present

## 2024-02-17 DIAGNOSIS — S0003XA Contusion of scalp, initial encounter: Secondary | ICD-10-CM | POA: Diagnosis not present

## 2024-02-17 DIAGNOSIS — Z7984 Long term (current) use of oral hypoglycemic drugs: Secondary | ICD-10-CM

## 2024-02-17 DIAGNOSIS — S0993XA Unspecified injury of face, initial encounter: Secondary | ICD-10-CM | POA: Diagnosis not present

## 2024-02-17 DIAGNOSIS — E119 Type 2 diabetes mellitus without complications: Secondary | ICD-10-CM | POA: Diagnosis not present

## 2024-02-17 DIAGNOSIS — S82045A Nondisplaced comminuted fracture of left patella, initial encounter for closed fracture: Secondary | ICD-10-CM | POA: Diagnosis not present

## 2024-02-17 DIAGNOSIS — D329 Benign neoplasm of meninges, unspecified: Secondary | ICD-10-CM | POA: Diagnosis not present

## 2024-02-17 DIAGNOSIS — R531 Weakness: Secondary | ICD-10-CM | POA: Diagnosis not present

## 2024-02-17 DIAGNOSIS — Z79891 Long term (current) use of opiate analgesic: Secondary | ICD-10-CM

## 2024-02-17 DIAGNOSIS — S51812A Laceration without foreign body of left forearm, initial encounter: Secondary | ICD-10-CM | POA: Diagnosis not present

## 2024-02-17 DIAGNOSIS — S8991XA Unspecified injury of right lower leg, initial encounter: Secondary | ICD-10-CM | POA: Diagnosis not present

## 2024-02-17 DIAGNOSIS — Z87891 Personal history of nicotine dependence: Secondary | ICD-10-CM | POA: Diagnosis not present

## 2024-02-17 DIAGNOSIS — Z9181 History of falling: Secondary | ICD-10-CM | POA: Diagnosis not present

## 2024-02-17 DIAGNOSIS — S51802D Unspecified open wound of left forearm, subsequent encounter: Secondary | ICD-10-CM

## 2024-02-17 DIAGNOSIS — S82042A Displaced comminuted fracture of left patella, initial encounter for closed fracture: Secondary | ICD-10-CM | POA: Diagnosis not present

## 2024-02-17 DIAGNOSIS — Z7401 Bed confinement status: Secondary | ICD-10-CM | POA: Diagnosis not present

## 2024-02-17 DIAGNOSIS — Z7901 Long term (current) use of anticoagulants: Secondary | ICD-10-CM

## 2024-02-18 DIAGNOSIS — M79641 Pain in right hand: Secondary | ICD-10-CM | POA: Diagnosis not present

## 2024-02-18 DIAGNOSIS — M25531 Pain in right wrist: Secondary | ICD-10-CM | POA: Diagnosis not present

## 2024-02-19 DIAGNOSIS — E118 Type 2 diabetes mellitus with unspecified complications: Secondary | ICD-10-CM | POA: Diagnosis not present

## 2024-02-19 DIAGNOSIS — R238 Other skin changes: Secondary | ICD-10-CM | POA: Diagnosis not present

## 2024-02-19 DIAGNOSIS — I739 Peripheral vascular disease, unspecified: Secondary | ICD-10-CM | POA: Diagnosis not present

## 2024-02-19 DIAGNOSIS — R262 Difficulty in walking, not elsewhere classified: Secondary | ICD-10-CM | POA: Diagnosis not present

## 2024-02-19 DIAGNOSIS — R601 Generalized edema: Secondary | ICD-10-CM | POA: Diagnosis not present

## 2024-02-19 DIAGNOSIS — B351 Tinea unguium: Secondary | ICD-10-CM | POA: Diagnosis not present

## 2024-02-19 DIAGNOSIS — I872 Venous insufficiency (chronic) (peripheral): Secondary | ICD-10-CM | POA: Diagnosis not present

## 2024-02-19 DIAGNOSIS — I10 Essential (primary) hypertension: Secondary | ICD-10-CM | POA: Diagnosis not present

## 2024-02-19 DIAGNOSIS — M6281 Muscle weakness (generalized): Secondary | ICD-10-CM | POA: Diagnosis not present

## 2024-02-21 DIAGNOSIS — S51802D Unspecified open wound of left forearm, subsequent encounter: Secondary | ICD-10-CM | POA: Diagnosis not present

## 2024-02-21 DIAGNOSIS — Z79891 Long term (current) use of opiate analgesic: Secondary | ICD-10-CM | POA: Diagnosis not present

## 2024-02-21 DIAGNOSIS — Z9181 History of falling: Secondary | ICD-10-CM | POA: Diagnosis not present

## 2024-02-21 DIAGNOSIS — Z7901 Long term (current) use of anticoagulants: Secondary | ICD-10-CM | POA: Diagnosis not present

## 2024-02-21 DIAGNOSIS — Z7984 Long term (current) use of oral hypoglycemic drugs: Secondary | ICD-10-CM | POA: Diagnosis not present

## 2024-02-22 DIAGNOSIS — M6281 Muscle weakness (generalized): Secondary | ICD-10-CM | POA: Diagnosis not present

## 2024-02-22 DIAGNOSIS — S42255A Nondisplaced fracture of greater tuberosity of left humerus, initial encounter for closed fracture: Secondary | ICD-10-CM | POA: Diagnosis not present

## 2024-02-22 DIAGNOSIS — Z7901 Long term (current) use of anticoagulants: Secondary | ICD-10-CM | POA: Diagnosis not present

## 2024-02-22 DIAGNOSIS — R6889 Other general symptoms and signs: Secondary | ICD-10-CM | POA: Diagnosis not present

## 2024-02-22 DIAGNOSIS — I4891 Unspecified atrial fibrillation: Secondary | ICD-10-CM | POA: Diagnosis not present

## 2024-02-22 DIAGNOSIS — S4292XA Fracture of left shoulder girdle, part unspecified, initial encounter for closed fracture: Secondary | ICD-10-CM | POA: Diagnosis not present

## 2024-02-22 DIAGNOSIS — W19XXXA Unspecified fall, initial encounter: Secondary | ICD-10-CM | POA: Diagnosis not present

## 2024-02-22 DIAGNOSIS — S51811A Laceration without foreign body of right forearm, initial encounter: Secondary | ICD-10-CM | POA: Diagnosis not present

## 2024-02-22 DIAGNOSIS — N179 Acute kidney failure, unspecified: Secondary | ICD-10-CM | POA: Diagnosis not present

## 2024-02-22 DIAGNOSIS — Z7401 Bed confinement status: Secondary | ICD-10-CM | POA: Diagnosis not present

## 2024-02-22 DIAGNOSIS — S42215A Unspecified nondisplaced fracture of surgical neck of left humerus, initial encounter for closed fracture: Secondary | ICD-10-CM | POA: Diagnosis not present

## 2024-02-22 DIAGNOSIS — R404 Transient alteration of awareness: Secondary | ICD-10-CM | POA: Diagnosis not present

## 2024-02-22 DIAGNOSIS — S8001XA Contusion of right knee, initial encounter: Secondary | ICD-10-CM | POA: Diagnosis not present

## 2024-02-22 DIAGNOSIS — R61 Generalized hyperhidrosis: Secondary | ICD-10-CM | POA: Diagnosis not present

## 2024-02-22 DIAGNOSIS — Z743 Need for continuous supervision: Secondary | ICD-10-CM | POA: Diagnosis not present

## 2024-02-22 DIAGNOSIS — S82002A Unspecified fracture of left patella, initial encounter for closed fracture: Secondary | ICD-10-CM | POA: Diagnosis not present

## 2024-02-22 DIAGNOSIS — F0390 Unspecified dementia without behavioral disturbance: Secondary | ICD-10-CM | POA: Diagnosis not present

## 2024-02-22 DIAGNOSIS — S51812A Laceration without foreign body of left forearm, initial encounter: Secondary | ICD-10-CM | POA: Diagnosis not present

## 2024-02-22 DIAGNOSIS — R9431 Abnormal electrocardiogram [ECG] [EKG]: Secondary | ICD-10-CM | POA: Diagnosis not present

## 2024-02-22 DIAGNOSIS — R079 Chest pain, unspecified: Secondary | ICD-10-CM | POA: Diagnosis not present

## 2024-02-22 DIAGNOSIS — S8002XA Contusion of left knee, initial encounter: Secondary | ICD-10-CM | POA: Diagnosis not present

## 2024-02-23 DIAGNOSIS — E7800 Pure hypercholesterolemia, unspecified: Secondary | ICD-10-CM | POA: Diagnosis not present

## 2024-02-23 DIAGNOSIS — M25562 Pain in left knee: Secondary | ICD-10-CM | POA: Diagnosis not present

## 2024-02-23 DIAGNOSIS — R059 Cough, unspecified: Secondary | ICD-10-CM | POA: Diagnosis not present

## 2024-02-23 DIAGNOSIS — Z79899 Other long term (current) drug therapy: Secondary | ICD-10-CM | POA: Diagnosis not present

## 2024-02-23 DIAGNOSIS — Z1152 Encounter for screening for COVID-19: Secondary | ICD-10-CM | POA: Diagnosis not present

## 2024-02-23 DIAGNOSIS — T07XXXA Unspecified multiple injuries, initial encounter: Secondary | ICD-10-CM | POA: Diagnosis not present

## 2024-02-23 DIAGNOSIS — Z87891 Personal history of nicotine dependence: Secondary | ICD-10-CM | POA: Diagnosis not present

## 2024-02-23 DIAGNOSIS — Z7984 Long term (current) use of oral hypoglycemic drugs: Secondary | ICD-10-CM | POA: Diagnosis not present

## 2024-02-23 DIAGNOSIS — I129 Hypertensive chronic kidney disease with stage 1 through stage 4 chronic kidney disease, or unspecified chronic kidney disease: Secondary | ICD-10-CM | POA: Diagnosis not present

## 2024-02-23 DIAGNOSIS — Z9181 History of falling: Secondary | ICD-10-CM | POA: Diagnosis not present

## 2024-02-23 DIAGNOSIS — Z7901 Long term (current) use of anticoagulants: Secondary | ICD-10-CM | POA: Diagnosis not present

## 2024-02-23 DIAGNOSIS — R61 Generalized hyperhidrosis: Secondary | ICD-10-CM | POA: Diagnosis not present

## 2024-02-23 DIAGNOSIS — N189 Chronic kidney disease, unspecified: Secondary | ICD-10-CM | POA: Diagnosis not present

## 2024-02-23 DIAGNOSIS — Z743 Need for continuous supervision: Secondary | ICD-10-CM | POA: Diagnosis not present

## 2024-02-23 DIAGNOSIS — Z79891 Long term (current) use of opiate analgesic: Secondary | ICD-10-CM | POA: Diagnosis not present

## 2024-02-23 DIAGNOSIS — S51802D Unspecified open wound of left forearm, subsequent encounter: Secondary | ICD-10-CM | POA: Diagnosis not present

## 2024-02-23 DIAGNOSIS — N179 Acute kidney failure, unspecified: Secondary | ICD-10-CM | POA: Diagnosis not present

## 2024-02-23 DIAGNOSIS — R9431 Abnormal electrocardiogram [ECG] [EKG]: Secondary | ICD-10-CM | POA: Diagnosis not present

## 2024-02-23 DIAGNOSIS — I4891 Unspecified atrial fibrillation: Secondary | ICD-10-CM | POA: Diagnosis not present

## 2024-02-23 DIAGNOSIS — Z7401 Bed confinement status: Secondary | ICD-10-CM | POA: Diagnosis not present

## 2024-02-23 DIAGNOSIS — W19XXXA Unspecified fall, initial encounter: Secondary | ICD-10-CM | POA: Diagnosis not present

## 2024-02-23 DIAGNOSIS — Z8673 Personal history of transient ischemic attack (TIA), and cerebral infarction without residual deficits: Secondary | ICD-10-CM | POA: Diagnosis not present

## 2024-02-23 DIAGNOSIS — R404 Transient alteration of awareness: Secondary | ICD-10-CM | POA: Diagnosis not present

## 2024-02-25 DIAGNOSIS — Z743 Need for continuous supervision: Secondary | ICD-10-CM | POA: Diagnosis not present

## 2024-02-25 DIAGNOSIS — Z7401 Bed confinement status: Secondary | ICD-10-CM | POA: Diagnosis not present

## 2024-02-25 DIAGNOSIS — R296 Repeated falls: Secondary | ICD-10-CM | POA: Diagnosis not present

## 2024-02-25 DIAGNOSIS — R6889 Other general symptoms and signs: Secondary | ICD-10-CM | POA: Diagnosis not present

## 2024-02-25 DIAGNOSIS — Y92129 Unspecified place in nursing home as the place of occurrence of the external cause: Secondary | ICD-10-CM | POA: Diagnosis not present

## 2024-02-25 DIAGNOSIS — Z043 Encounter for examination and observation following other accident: Secondary | ICD-10-CM | POA: Diagnosis not present

## 2024-02-25 DIAGNOSIS — W19XXXA Unspecified fall, initial encounter: Secondary | ICD-10-CM | POA: Diagnosis not present

## 2024-02-25 DIAGNOSIS — S0990XA Unspecified injury of head, initial encounter: Secondary | ICD-10-CM | POA: Diagnosis not present

## 2024-02-25 DIAGNOSIS — M549 Dorsalgia, unspecified: Secondary | ICD-10-CM | POA: Diagnosis not present

## 2024-02-26 DIAGNOSIS — Z79891 Long term (current) use of opiate analgesic: Secondary | ICD-10-CM | POA: Diagnosis not present

## 2024-02-26 DIAGNOSIS — Z9181 History of falling: Secondary | ICD-10-CM | POA: Diagnosis not present

## 2024-02-26 DIAGNOSIS — Z7984 Long term (current) use of oral hypoglycemic drugs: Secondary | ICD-10-CM | POA: Diagnosis not present

## 2024-02-26 DIAGNOSIS — Z7901 Long term (current) use of anticoagulants: Secondary | ICD-10-CM | POA: Diagnosis not present

## 2024-02-26 DIAGNOSIS — S51802D Unspecified open wound of left forearm, subsequent encounter: Secondary | ICD-10-CM | POA: Diagnosis not present

## 2024-02-28 DIAGNOSIS — Z743 Need for continuous supervision: Secondary | ICD-10-CM | POA: Diagnosis not present

## 2024-02-28 DIAGNOSIS — S82045A Nondisplaced comminuted fracture of left patella, initial encounter for closed fracture: Secondary | ICD-10-CM | POA: Diagnosis not present

## 2024-02-28 DIAGNOSIS — I4891 Unspecified atrial fibrillation: Secondary | ICD-10-CM | POA: Diagnosis not present

## 2024-02-28 DIAGNOSIS — E876 Hypokalemia: Secondary | ICD-10-CM | POA: Diagnosis not present

## 2024-02-28 DIAGNOSIS — R410 Disorientation, unspecified: Secondary | ICD-10-CM | POA: Diagnosis not present

## 2024-02-28 DIAGNOSIS — S42202A Unspecified fracture of upper end of left humerus, initial encounter for closed fracture: Secondary | ICD-10-CM | POA: Diagnosis not present

## 2024-02-28 DIAGNOSIS — R531 Weakness: Secondary | ICD-10-CM | POA: Diagnosis not present

## 2024-02-28 DIAGNOSIS — W19XXXA Unspecified fall, initial encounter: Secondary | ICD-10-CM | POA: Diagnosis not present

## 2024-02-28 DIAGNOSIS — N189 Chronic kidney disease, unspecified: Secondary | ICD-10-CM | POA: Diagnosis not present

## 2024-02-29 DIAGNOSIS — I10 Essential (primary) hypertension: Secondary | ICD-10-CM | POA: Diagnosis not present

## 2024-02-29 DIAGNOSIS — Z7409 Other reduced mobility: Secondary | ICD-10-CM | POA: Diagnosis not present

## 2024-02-29 DIAGNOSIS — R1312 Dysphagia, oropharyngeal phase: Secondary | ICD-10-CM | POA: Diagnosis not present

## 2024-02-29 DIAGNOSIS — S82002A Unspecified fracture of left patella, initial encounter for closed fracture: Secondary | ICD-10-CM | POA: Diagnosis not present

## 2024-02-29 DIAGNOSIS — E119 Type 2 diabetes mellitus without complications: Secondary | ICD-10-CM | POA: Diagnosis not present

## 2024-02-29 DIAGNOSIS — K219 Gastro-esophageal reflux disease without esophagitis: Secondary | ICD-10-CM | POA: Diagnosis not present

## 2024-02-29 DIAGNOSIS — N189 Chronic kidney disease, unspecified: Secondary | ICD-10-CM | POA: Diagnosis not present

## 2024-02-29 DIAGNOSIS — Z741 Need for assistance with personal care: Secondary | ICD-10-CM | POA: Diagnosis not present

## 2024-02-29 DIAGNOSIS — E876 Hypokalemia: Secondary | ICD-10-CM | POA: Diagnosis not present

## 2024-02-29 DIAGNOSIS — Z7984 Long term (current) use of oral hypoglycemic drugs: Secondary | ICD-10-CM | POA: Diagnosis not present

## 2024-02-29 DIAGNOSIS — N179 Acute kidney failure, unspecified: Secondary | ICD-10-CM | POA: Diagnosis not present

## 2024-02-29 DIAGNOSIS — S42202D Unspecified fracture of upper end of left humerus, subsequent encounter for fracture with routine healing: Secondary | ICD-10-CM | POA: Diagnosis not present

## 2024-02-29 DIAGNOSIS — Z7901 Long term (current) use of anticoagulants: Secondary | ICD-10-CM | POA: Diagnosis not present

## 2024-02-29 DIAGNOSIS — H3530 Unspecified macular degeneration: Secondary | ICD-10-CM | POA: Diagnosis not present

## 2024-02-29 DIAGNOSIS — W19XXXA Unspecified fall, initial encounter: Secondary | ICD-10-CM | POA: Diagnosis not present

## 2024-02-29 DIAGNOSIS — I4891 Unspecified atrial fibrillation: Secondary | ICD-10-CM | POA: Diagnosis not present

## 2024-02-29 DIAGNOSIS — D72829 Elevated white blood cell count, unspecified: Secondary | ICD-10-CM | POA: Diagnosis not present

## 2024-02-29 DIAGNOSIS — I482 Chronic atrial fibrillation, unspecified: Secondary | ICD-10-CM | POA: Diagnosis not present

## 2024-02-29 DIAGNOSIS — S82045A Nondisplaced comminuted fracture of left patella, initial encounter for closed fracture: Secondary | ICD-10-CM | POA: Diagnosis not present

## 2024-02-29 DIAGNOSIS — Z86011 Personal history of benign neoplasm of the brain: Secondary | ICD-10-CM | POA: Diagnosis not present

## 2024-02-29 DIAGNOSIS — S42202A Unspecified fracture of upper end of left humerus, initial encounter for closed fracture: Secondary | ICD-10-CM | POA: Diagnosis not present

## 2024-02-29 DIAGNOSIS — S82002D Unspecified fracture of left patella, subsequent encounter for closed fracture with routine healing: Secondary | ICD-10-CM | POA: Diagnosis not present

## 2024-02-29 DIAGNOSIS — R262 Difficulty in walking, not elsewhere classified: Secondary | ICD-10-CM | POA: Diagnosis not present

## 2024-02-29 DIAGNOSIS — E871 Hypo-osmolality and hyponatremia: Secondary | ICD-10-CM | POA: Diagnosis not present

## 2024-02-29 DIAGNOSIS — E7800 Pure hypercholesterolemia, unspecified: Secondary | ICD-10-CM | POA: Diagnosis not present

## 2024-02-29 DIAGNOSIS — R2681 Unsteadiness on feet: Secondary | ICD-10-CM | POA: Diagnosis not present

## 2024-02-29 DIAGNOSIS — E785 Hyperlipidemia, unspecified: Secondary | ICD-10-CM | POA: Diagnosis not present

## 2024-02-29 DIAGNOSIS — G9341 Metabolic encephalopathy: Secondary | ICD-10-CM | POA: Diagnosis not present

## 2024-02-29 DIAGNOSIS — M1990 Unspecified osteoarthritis, unspecified site: Secondary | ICD-10-CM | POA: Diagnosis not present

## 2024-02-29 DIAGNOSIS — Z8673 Personal history of transient ischemic attack (TIA), and cerebral infarction without residual deficits: Secondary | ICD-10-CM | POA: Diagnosis not present

## 2024-02-29 DIAGNOSIS — Z79891 Long term (current) use of opiate analgesic: Secondary | ICD-10-CM | POA: Diagnosis not present

## 2024-02-29 DIAGNOSIS — Z743 Need for continuous supervision: Secondary | ICD-10-CM | POA: Diagnosis not present

## 2024-02-29 DIAGNOSIS — R531 Weakness: Secondary | ICD-10-CM | POA: Diagnosis not present

## 2024-02-29 DIAGNOSIS — R410 Disorientation, unspecified: Secondary | ICD-10-CM | POA: Diagnosis not present

## 2024-02-29 DIAGNOSIS — H548 Legal blindness, as defined in USA: Secondary | ICD-10-CM | POA: Diagnosis not present

## 2024-02-29 DIAGNOSIS — M6281 Muscle weakness (generalized): Secondary | ICD-10-CM | POA: Diagnosis not present

## 2024-02-29 DIAGNOSIS — R296 Repeated falls: Secondary | ICD-10-CM | POA: Diagnosis not present

## 2024-02-29 DIAGNOSIS — Z9889 Other specified postprocedural states: Secondary | ICD-10-CM | POA: Diagnosis not present

## 2024-03-02 DIAGNOSIS — E876 Hypokalemia: Secondary | ICD-10-CM | POA: Diagnosis not present

## 2024-03-02 DIAGNOSIS — S82002A Unspecified fracture of left patella, initial encounter for closed fracture: Secondary | ICD-10-CM | POA: Diagnosis not present

## 2024-03-02 DIAGNOSIS — E785 Hyperlipidemia, unspecified: Secondary | ICD-10-CM | POA: Diagnosis not present

## 2024-03-02 DIAGNOSIS — M6281 Muscle weakness (generalized): Secondary | ICD-10-CM | POA: Diagnosis not present

## 2024-03-02 DIAGNOSIS — S82002D Unspecified fracture of left patella, subsequent encounter for closed fracture with routine healing: Secondary | ICD-10-CM | POA: Diagnosis not present

## 2024-03-02 DIAGNOSIS — R2681 Unsteadiness on feet: Secondary | ICD-10-CM | POA: Diagnosis not present

## 2024-03-02 DIAGNOSIS — R2689 Other abnormalities of gait and mobility: Secondary | ICD-10-CM | POA: Diagnosis not present

## 2024-03-02 DIAGNOSIS — R262 Difficulty in walking, not elsewhere classified: Secondary | ICD-10-CM | POA: Diagnosis not present

## 2024-03-02 DIAGNOSIS — G9340 Encephalopathy, unspecified: Secondary | ICD-10-CM | POA: Diagnosis not present

## 2024-03-02 DIAGNOSIS — I482 Chronic atrial fibrillation, unspecified: Secondary | ICD-10-CM | POA: Diagnosis not present

## 2024-03-02 DIAGNOSIS — Z7409 Other reduced mobility: Secondary | ICD-10-CM | POA: Diagnosis not present

## 2024-03-02 DIAGNOSIS — R1312 Dysphagia, oropharyngeal phase: Secondary | ICD-10-CM | POA: Diagnosis not present

## 2024-03-02 DIAGNOSIS — H548 Legal blindness, as defined in USA: Secondary | ICD-10-CM | POA: Diagnosis not present

## 2024-03-02 DIAGNOSIS — Z741 Need for assistance with personal care: Secondary | ICD-10-CM | POA: Diagnosis not present

## 2024-03-02 DIAGNOSIS — E119 Type 2 diabetes mellitus without complications: Secondary | ICD-10-CM | POA: Diagnosis not present

## 2024-03-02 DIAGNOSIS — R296 Repeated falls: Secondary | ICD-10-CM | POA: Diagnosis not present

## 2024-03-02 DIAGNOSIS — Z743 Need for continuous supervision: Secondary | ICD-10-CM | POA: Diagnosis not present

## 2024-03-02 DIAGNOSIS — R410 Disorientation, unspecified: Secondary | ICD-10-CM | POA: Diagnosis not present

## 2024-03-02 DIAGNOSIS — Z9889 Other specified postprocedural states: Secondary | ICD-10-CM | POA: Diagnosis not present

## 2024-03-02 DIAGNOSIS — G9341 Metabolic encephalopathy: Secondary | ICD-10-CM | POA: Diagnosis not present

## 2024-03-02 DIAGNOSIS — S42202A Unspecified fracture of upper end of left humerus, initial encounter for closed fracture: Secondary | ICD-10-CM | POA: Diagnosis not present

## 2024-03-02 DIAGNOSIS — K219 Gastro-esophageal reflux disease without esophagitis: Secondary | ICD-10-CM | POA: Diagnosis not present

## 2024-03-02 DIAGNOSIS — I10 Essential (primary) hypertension: Secondary | ICD-10-CM | POA: Diagnosis not present

## 2024-03-02 DIAGNOSIS — S42202D Unspecified fracture of upper end of left humerus, subsequent encounter for fracture with routine healing: Secondary | ICD-10-CM | POA: Diagnosis not present

## 2024-03-02 DIAGNOSIS — Z7901 Long term (current) use of anticoagulants: Secondary | ICD-10-CM | POA: Diagnosis not present

## 2024-03-02 DIAGNOSIS — H3530 Unspecified macular degeneration: Secondary | ICD-10-CM | POA: Diagnosis not present

## 2024-03-04 DIAGNOSIS — R2689 Other abnormalities of gait and mobility: Secondary | ICD-10-CM | POA: Diagnosis not present

## 2024-03-04 DIAGNOSIS — E876 Hypokalemia: Secondary | ICD-10-CM | POA: Diagnosis not present

## 2024-03-04 DIAGNOSIS — G9340 Encephalopathy, unspecified: Secondary | ICD-10-CM | POA: Diagnosis not present

## 2024-03-04 DIAGNOSIS — R296 Repeated falls: Secondary | ICD-10-CM | POA: Diagnosis not present

## 2024-03-05 DIAGNOSIS — S42202D Unspecified fracture of upper end of left humerus, subsequent encounter for fracture with routine healing: Secondary | ICD-10-CM | POA: Diagnosis not present

## 2024-03-05 DIAGNOSIS — E119 Type 2 diabetes mellitus without complications: Secondary | ICD-10-CM | POA: Diagnosis not present

## 2024-03-05 DIAGNOSIS — S82002D Unspecified fracture of left patella, subsequent encounter for closed fracture with routine healing: Secondary | ICD-10-CM | POA: Diagnosis not present

## 2024-03-13 DIAGNOSIS — S82002A Unspecified fracture of left patella, initial encounter for closed fracture: Secondary | ICD-10-CM | POA: Diagnosis not present

## 2024-03-13 DIAGNOSIS — S42202A Unspecified fracture of upper end of left humerus, initial encounter for closed fracture: Secondary | ICD-10-CM | POA: Diagnosis not present

## 2024-03-20 DIAGNOSIS — S42202A Unspecified fracture of upper end of left humerus, initial encounter for closed fracture: Secondary | ICD-10-CM | POA: Diagnosis not present

## 2024-03-20 DIAGNOSIS — S82002A Unspecified fracture of left patella, initial encounter for closed fracture: Secondary | ICD-10-CM | POA: Diagnosis not present

## 2024-03-27 DIAGNOSIS — E119 Type 2 diabetes mellitus without complications: Secondary | ICD-10-CM | POA: Diagnosis not present

## 2024-03-27 DIAGNOSIS — I482 Chronic atrial fibrillation, unspecified: Secondary | ICD-10-CM | POA: Diagnosis not present

## 2024-03-27 DIAGNOSIS — S42202D Unspecified fracture of upper end of left humerus, subsequent encounter for fracture with routine healing: Secondary | ICD-10-CM | POA: Diagnosis not present

## 2024-03-27 DIAGNOSIS — S82002D Unspecified fracture of left patella, subsequent encounter for closed fracture with routine healing: Secondary | ICD-10-CM | POA: Diagnosis not present

## 2024-03-29 DIAGNOSIS — S42202D Unspecified fracture of upper end of left humerus, subsequent encounter for fracture with routine healing: Secondary | ICD-10-CM | POA: Diagnosis not present

## 2024-03-29 DIAGNOSIS — S82002D Unspecified fracture of left patella, subsequent encounter for closed fracture with routine healing: Secondary | ICD-10-CM | POA: Diagnosis not present

## 2024-03-29 DIAGNOSIS — E119 Type 2 diabetes mellitus without complications: Secondary | ICD-10-CM | POA: Diagnosis not present

## 2024-04-02 DIAGNOSIS — Z7901 Long term (current) use of anticoagulants: Secondary | ICD-10-CM | POA: Diagnosis not present

## 2024-04-02 DIAGNOSIS — Z79891 Long term (current) use of opiate analgesic: Secondary | ICD-10-CM | POA: Diagnosis not present

## 2024-04-02 DIAGNOSIS — Z7984 Long term (current) use of oral hypoglycemic drugs: Secondary | ICD-10-CM | POA: Diagnosis not present

## 2024-04-02 DIAGNOSIS — S51802D Unspecified open wound of left forearm, subsequent encounter: Secondary | ICD-10-CM | POA: Diagnosis not present

## 2024-04-02 DIAGNOSIS — Z9181 History of falling: Secondary | ICD-10-CM | POA: Diagnosis not present

## 2024-04-04 DIAGNOSIS — R600 Localized edema: Secondary | ICD-10-CM | POA: Diagnosis not present

## 2024-04-04 DIAGNOSIS — M6281 Muscle weakness (generalized): Secondary | ICD-10-CM | POA: Diagnosis not present

## 2024-04-04 DIAGNOSIS — Z7901 Long term (current) use of anticoagulants: Secondary | ICD-10-CM | POA: Diagnosis not present

## 2024-04-04 DIAGNOSIS — F0390 Unspecified dementia without behavioral disturbance: Secondary | ICD-10-CM

## 2024-04-04 DIAGNOSIS — S51802D Unspecified open wound of left forearm, subsequent encounter: Secondary | ICD-10-CM | POA: Diagnosis not present

## 2024-04-04 DIAGNOSIS — I129 Hypertensive chronic kidney disease with stage 1 through stage 4 chronic kidney disease, or unspecified chronic kidney disease: Secondary | ICD-10-CM | POA: Diagnosis not present

## 2024-04-04 DIAGNOSIS — Z7984 Long term (current) use of oral hypoglycemic drugs: Secondary | ICD-10-CM | POA: Diagnosis not present

## 2024-04-04 DIAGNOSIS — N183 Chronic kidney disease, stage 3 unspecified: Secondary | ICD-10-CM | POA: Diagnosis not present

## 2024-04-04 DIAGNOSIS — E1159 Type 2 diabetes mellitus with other circulatory complications: Secondary | ICD-10-CM | POA: Diagnosis not present

## 2024-04-04 DIAGNOSIS — Z9181 History of falling: Secondary | ICD-10-CM | POA: Diagnosis not present

## 2024-04-04 DIAGNOSIS — Z79891 Long term (current) use of opiate analgesic: Secondary | ICD-10-CM | POA: Diagnosis not present

## 2024-04-08 DIAGNOSIS — S82002A Unspecified fracture of left patella, initial encounter for closed fracture: Secondary | ICD-10-CM | POA: Diagnosis not present

## 2024-04-08 DIAGNOSIS — S42202A Unspecified fracture of upper end of left humerus, initial encounter for closed fracture: Secondary | ICD-10-CM | POA: Diagnosis not present

## 2024-04-09 DIAGNOSIS — Z9181 History of falling: Secondary | ICD-10-CM | POA: Diagnosis not present

## 2024-04-09 DIAGNOSIS — Z79891 Long term (current) use of opiate analgesic: Secondary | ICD-10-CM | POA: Diagnosis not present

## 2024-04-09 DIAGNOSIS — Z7984 Long term (current) use of oral hypoglycemic drugs: Secondary | ICD-10-CM | POA: Diagnosis not present

## 2024-04-09 DIAGNOSIS — Z7901 Long term (current) use of anticoagulants: Secondary | ICD-10-CM | POA: Diagnosis not present

## 2024-04-09 DIAGNOSIS — S51802D Unspecified open wound of left forearm, subsequent encounter: Secondary | ICD-10-CM | POA: Diagnosis not present

## 2024-04-11 DIAGNOSIS — Z79891 Long term (current) use of opiate analgesic: Secondary | ICD-10-CM | POA: Diagnosis not present

## 2024-04-11 DIAGNOSIS — S51802D Unspecified open wound of left forearm, subsequent encounter: Secondary | ICD-10-CM | POA: Diagnosis not present

## 2024-04-11 DIAGNOSIS — Z7901 Long term (current) use of anticoagulants: Secondary | ICD-10-CM | POA: Diagnosis not present

## 2024-04-11 DIAGNOSIS — Z9181 History of falling: Secondary | ICD-10-CM | POA: Diagnosis not present

## 2024-04-11 DIAGNOSIS — Z7984 Long term (current) use of oral hypoglycemic drugs: Secondary | ICD-10-CM | POA: Diagnosis not present

## 2024-04-15 DIAGNOSIS — S51802D Unspecified open wound of left forearm, subsequent encounter: Secondary | ICD-10-CM | POA: Diagnosis not present

## 2024-04-15 DIAGNOSIS — Z9181 History of falling: Secondary | ICD-10-CM | POA: Diagnosis not present

## 2024-04-15 DIAGNOSIS — Z7984 Long term (current) use of oral hypoglycemic drugs: Secondary | ICD-10-CM | POA: Diagnosis not present

## 2024-04-15 DIAGNOSIS — Z7901 Long term (current) use of anticoagulants: Secondary | ICD-10-CM | POA: Diagnosis not present

## 2024-04-15 DIAGNOSIS — Z79891 Long term (current) use of opiate analgesic: Secondary | ICD-10-CM | POA: Diagnosis not present

## 2024-04-17 DIAGNOSIS — S82042D Displaced comminuted fracture of left patella, subsequent encounter for closed fracture with routine healing: Secondary | ICD-10-CM | POA: Diagnosis not present

## 2024-04-17 DIAGNOSIS — M19012 Primary osteoarthritis, left shoulder: Secondary | ICD-10-CM | POA: Diagnosis not present

## 2024-04-17 DIAGNOSIS — S42212D Unspecified displaced fracture of surgical neck of left humerus, subsequent encounter for fracture with routine healing: Secondary | ICD-10-CM | POA: Diagnosis not present

## 2024-04-17 DIAGNOSIS — S51802D Unspecified open wound of left forearm, subsequent encounter: Secondary | ICD-10-CM | POA: Diagnosis not present

## 2024-04-18 DIAGNOSIS — Z79899 Other long term (current) drug therapy: Secondary | ICD-10-CM | POA: Diagnosis not present

## 2024-04-18 DIAGNOSIS — F0390 Unspecified dementia without behavioral disturbance: Secondary | ICD-10-CM | POA: Diagnosis not present

## 2024-04-18 DIAGNOSIS — M6281 Muscle weakness (generalized): Secondary | ICD-10-CM | POA: Diagnosis not present

## 2024-04-23 DIAGNOSIS — I87311 Chronic venous hypertension (idiopathic) with ulcer of right lower extremity: Secondary | ICD-10-CM | POA: Diagnosis not present

## 2024-04-25 DIAGNOSIS — N183 Chronic kidney disease, stage 3 unspecified: Secondary | ICD-10-CM | POA: Diagnosis not present

## 2024-04-25 DIAGNOSIS — G894 Chronic pain syndrome: Secondary | ICD-10-CM | POA: Diagnosis not present

## 2024-04-25 DIAGNOSIS — M25512 Pain in left shoulder: Secondary | ICD-10-CM | POA: Diagnosis not present

## 2024-04-25 DIAGNOSIS — R600 Localized edema: Secondary | ICD-10-CM | POA: Diagnosis not present

## 2024-04-25 DIAGNOSIS — M6281 Muscle weakness (generalized): Secondary | ICD-10-CM | POA: Diagnosis not present

## 2024-04-25 DIAGNOSIS — F0390 Unspecified dementia without behavioral disturbance: Secondary | ICD-10-CM

## 2024-04-25 DIAGNOSIS — I129 Hypertensive chronic kidney disease with stage 1 through stage 4 chronic kidney disease, or unspecified chronic kidney disease: Secondary | ICD-10-CM | POA: Diagnosis not present

## 2024-04-26 DIAGNOSIS — I482 Chronic atrial fibrillation, unspecified: Secondary | ICD-10-CM | POA: Diagnosis not present

## 2024-04-26 DIAGNOSIS — M47814 Spondylosis without myelopathy or radiculopathy, thoracic region: Secondary | ICD-10-CM | POA: Diagnosis not present

## 2024-04-26 DIAGNOSIS — N183 Chronic kidney disease, stage 3 unspecified: Secondary | ICD-10-CM | POA: Diagnosis not present

## 2024-04-26 DIAGNOSIS — I872 Venous insufficiency (chronic) (peripheral): Secondary | ICD-10-CM | POA: Diagnosis not present

## 2024-04-26 DIAGNOSIS — S51802D Unspecified open wound of left forearm, subsequent encounter: Secondary | ICD-10-CM | POA: Diagnosis not present

## 2024-04-26 DIAGNOSIS — E7800 Pure hypercholesterolemia, unspecified: Secondary | ICD-10-CM | POA: Diagnosis not present

## 2024-04-26 DIAGNOSIS — R350 Frequency of micturition: Secondary | ICD-10-CM | POA: Diagnosis not present

## 2024-04-26 DIAGNOSIS — L84 Corns and callosities: Secondary | ICD-10-CM | POA: Diagnosis not present

## 2024-04-26 DIAGNOSIS — R3914 Feeling of incomplete bladder emptying: Secondary | ICD-10-CM | POA: Diagnosis not present

## 2024-04-26 DIAGNOSIS — S82042D Displaced comminuted fracture of left patella, subsequent encounter for closed fracture with routine healing: Secondary | ICD-10-CM | POA: Diagnosis not present

## 2024-04-26 DIAGNOSIS — R238 Other skin changes: Secondary | ICD-10-CM | POA: Diagnosis not present

## 2024-04-26 DIAGNOSIS — R338 Other retention of urine: Secondary | ICD-10-CM | POA: Diagnosis not present

## 2024-04-26 DIAGNOSIS — I10 Essential (primary) hypertension: Secondary | ICD-10-CM | POA: Diagnosis not present

## 2024-04-26 DIAGNOSIS — M6281 Muscle weakness (generalized): Secondary | ICD-10-CM | POA: Diagnosis not present

## 2024-04-26 DIAGNOSIS — M47812 Spondylosis without myelopathy or radiculopathy, cervical region: Secondary | ICD-10-CM | POA: Diagnosis not present

## 2024-04-26 DIAGNOSIS — E118 Type 2 diabetes mellitus with unspecified complications: Secondary | ICD-10-CM | POA: Diagnosis not present

## 2024-04-26 DIAGNOSIS — E119 Type 2 diabetes mellitus without complications: Secondary | ICD-10-CM | POA: Diagnosis not present

## 2024-04-26 DIAGNOSIS — M19012 Primary osteoarthritis, left shoulder: Secondary | ICD-10-CM | POA: Diagnosis not present

## 2024-04-26 DIAGNOSIS — J432 Centrilobular emphysema: Secondary | ICD-10-CM | POA: Diagnosis not present

## 2024-04-26 DIAGNOSIS — R601 Generalized edema: Secondary | ICD-10-CM | POA: Diagnosis not present

## 2024-04-26 DIAGNOSIS — S42212D Unspecified displaced fracture of surgical neck of left humerus, subsequent encounter for fracture with routine healing: Secondary | ICD-10-CM | POA: Diagnosis not present

## 2024-04-26 DIAGNOSIS — J4489 Other specified chronic obstructive pulmonary disease: Secondary | ICD-10-CM | POA: Diagnosis not present

## 2024-04-26 DIAGNOSIS — G250 Essential tremor: Secondary | ICD-10-CM | POA: Diagnosis not present

## 2024-04-26 DIAGNOSIS — R3915 Urgency of urination: Secondary | ICD-10-CM | POA: Diagnosis not present

## 2024-04-26 DIAGNOSIS — I739 Peripheral vascular disease, unspecified: Secondary | ICD-10-CM | POA: Diagnosis not present

## 2024-04-26 DIAGNOSIS — R911 Solitary pulmonary nodule: Secondary | ICD-10-CM | POA: Diagnosis not present

## 2024-04-26 DIAGNOSIS — R262 Difficulty in walking, not elsewhere classified: Secondary | ICD-10-CM | POA: Diagnosis not present

## 2024-04-26 DIAGNOSIS — I129 Hypertensive chronic kidney disease with stage 1 through stage 4 chronic kidney disease, or unspecified chronic kidney disease: Secondary | ICD-10-CM | POA: Diagnosis not present

## 2024-04-26 DIAGNOSIS — B351 Tinea unguium: Secondary | ICD-10-CM | POA: Diagnosis not present

## 2024-04-26 DIAGNOSIS — R1310 Dysphagia, unspecified: Secondary | ICD-10-CM | POA: Diagnosis not present

## 2024-04-27 DIAGNOSIS — I129 Hypertensive chronic kidney disease with stage 1 through stage 4 chronic kidney disease, or unspecified chronic kidney disease: Secondary | ICD-10-CM | POA: Diagnosis not present

## 2024-04-27 DIAGNOSIS — I482 Chronic atrial fibrillation, unspecified: Secondary | ICD-10-CM | POA: Diagnosis not present

## 2024-04-27 DIAGNOSIS — R350 Frequency of micturition: Secondary | ICD-10-CM | POA: Diagnosis not present

## 2024-04-27 DIAGNOSIS — E7800 Pure hypercholesterolemia, unspecified: Secondary | ICD-10-CM | POA: Diagnosis not present

## 2024-04-27 DIAGNOSIS — R3915 Urgency of urination: Secondary | ICD-10-CM | POA: Diagnosis not present

## 2024-04-27 DIAGNOSIS — J4489 Other specified chronic obstructive pulmonary disease: Secondary | ICD-10-CM | POA: Diagnosis not present

## 2024-04-27 DIAGNOSIS — M47812 Spondylosis without myelopathy or radiculopathy, cervical region: Secondary | ICD-10-CM | POA: Diagnosis not present

## 2024-04-27 DIAGNOSIS — R338 Other retention of urine: Secondary | ICD-10-CM | POA: Diagnosis not present

## 2024-04-27 DIAGNOSIS — N183 Chronic kidney disease, stage 3 unspecified: Secondary | ICD-10-CM | POA: Diagnosis not present

## 2024-04-27 DIAGNOSIS — G250 Essential tremor: Secondary | ICD-10-CM | POA: Diagnosis not present

## 2024-04-27 DIAGNOSIS — S42212D Unspecified displaced fracture of surgical neck of left humerus, subsequent encounter for fracture with routine healing: Secondary | ICD-10-CM | POA: Diagnosis not present

## 2024-04-27 DIAGNOSIS — S51802D Unspecified open wound of left forearm, subsequent encounter: Secondary | ICD-10-CM | POA: Diagnosis not present

## 2024-04-27 DIAGNOSIS — S82042D Displaced comminuted fracture of left patella, subsequent encounter for closed fracture with routine healing: Secondary | ICD-10-CM | POA: Diagnosis not present

## 2024-04-27 DIAGNOSIS — M19012 Primary osteoarthritis, left shoulder: Secondary | ICD-10-CM | POA: Diagnosis not present

## 2024-04-27 DIAGNOSIS — R911 Solitary pulmonary nodule: Secondary | ICD-10-CM | POA: Diagnosis not present

## 2024-04-27 DIAGNOSIS — J432 Centrilobular emphysema: Secondary | ICD-10-CM | POA: Diagnosis not present

## 2024-04-27 DIAGNOSIS — M47814 Spondylosis without myelopathy or radiculopathy, thoracic region: Secondary | ICD-10-CM | POA: Diagnosis not present

## 2024-04-27 DIAGNOSIS — E119 Type 2 diabetes mellitus without complications: Secondary | ICD-10-CM | POA: Diagnosis not present

## 2024-04-27 DIAGNOSIS — R3914 Feeling of incomplete bladder emptying: Secondary | ICD-10-CM | POA: Diagnosis not present

## 2024-04-27 DIAGNOSIS — R1310 Dysphagia, unspecified: Secondary | ICD-10-CM | POA: Diagnosis not present

## 2024-04-30 DIAGNOSIS — J432 Centrilobular emphysema: Secondary | ICD-10-CM | POA: Diagnosis not present

## 2024-04-30 DIAGNOSIS — S51802D Unspecified open wound of left forearm, subsequent encounter: Secondary | ICD-10-CM | POA: Diagnosis not present

## 2024-04-30 DIAGNOSIS — E119 Type 2 diabetes mellitus without complications: Secondary | ICD-10-CM | POA: Diagnosis not present

## 2024-04-30 DIAGNOSIS — R3915 Urgency of urination: Secondary | ICD-10-CM | POA: Diagnosis not present

## 2024-04-30 DIAGNOSIS — J4489 Other specified chronic obstructive pulmonary disease: Secondary | ICD-10-CM | POA: Diagnosis not present

## 2024-04-30 DIAGNOSIS — I482 Chronic atrial fibrillation, unspecified: Secondary | ICD-10-CM | POA: Diagnosis not present

## 2024-04-30 DIAGNOSIS — G250 Essential tremor: Secondary | ICD-10-CM | POA: Diagnosis not present

## 2024-04-30 DIAGNOSIS — I129 Hypertensive chronic kidney disease with stage 1 through stage 4 chronic kidney disease, or unspecified chronic kidney disease: Secondary | ICD-10-CM | POA: Diagnosis not present

## 2024-04-30 DIAGNOSIS — R1310 Dysphagia, unspecified: Secondary | ICD-10-CM | POA: Diagnosis not present

## 2024-04-30 DIAGNOSIS — M19012 Primary osteoarthritis, left shoulder: Secondary | ICD-10-CM | POA: Diagnosis not present

## 2024-04-30 DIAGNOSIS — M47812 Spondylosis without myelopathy or radiculopathy, cervical region: Secondary | ICD-10-CM | POA: Diagnosis not present

## 2024-04-30 DIAGNOSIS — R338 Other retention of urine: Secondary | ICD-10-CM | POA: Diagnosis not present

## 2024-04-30 DIAGNOSIS — R350 Frequency of micturition: Secondary | ICD-10-CM | POA: Diagnosis not present

## 2024-04-30 DIAGNOSIS — E7800 Pure hypercholesterolemia, unspecified: Secondary | ICD-10-CM | POA: Diagnosis not present

## 2024-04-30 DIAGNOSIS — M47814 Spondylosis without myelopathy or radiculopathy, thoracic region: Secondary | ICD-10-CM | POA: Diagnosis not present

## 2024-04-30 DIAGNOSIS — S42212D Unspecified displaced fracture of surgical neck of left humerus, subsequent encounter for fracture with routine healing: Secondary | ICD-10-CM | POA: Diagnosis not present

## 2024-04-30 DIAGNOSIS — S82042D Displaced comminuted fracture of left patella, subsequent encounter for closed fracture with routine healing: Secondary | ICD-10-CM | POA: Diagnosis not present

## 2024-04-30 DIAGNOSIS — N183 Chronic kidney disease, stage 3 unspecified: Secondary | ICD-10-CM | POA: Diagnosis not present

## 2024-04-30 DIAGNOSIS — R911 Solitary pulmonary nodule: Secondary | ICD-10-CM | POA: Diagnosis not present

## 2024-04-30 DIAGNOSIS — R3914 Feeling of incomplete bladder emptying: Secondary | ICD-10-CM | POA: Diagnosis not present

## 2024-05-01 DIAGNOSIS — J4489 Other specified chronic obstructive pulmonary disease: Secondary | ICD-10-CM | POA: Diagnosis not present

## 2024-05-01 DIAGNOSIS — R350 Frequency of micturition: Secondary | ICD-10-CM | POA: Diagnosis not present

## 2024-05-01 DIAGNOSIS — I129 Hypertensive chronic kidney disease with stage 1 through stage 4 chronic kidney disease, or unspecified chronic kidney disease: Secondary | ICD-10-CM | POA: Diagnosis not present

## 2024-05-01 DIAGNOSIS — M19012 Primary osteoarthritis, left shoulder: Secondary | ICD-10-CM | POA: Diagnosis not present

## 2024-05-01 DIAGNOSIS — N183 Chronic kidney disease, stage 3 unspecified: Secondary | ICD-10-CM | POA: Diagnosis not present

## 2024-05-01 DIAGNOSIS — M47814 Spondylosis without myelopathy or radiculopathy, thoracic region: Secondary | ICD-10-CM | POA: Diagnosis not present

## 2024-05-01 DIAGNOSIS — R911 Solitary pulmonary nodule: Secondary | ICD-10-CM | POA: Diagnosis not present

## 2024-05-01 DIAGNOSIS — R3914 Feeling of incomplete bladder emptying: Secondary | ICD-10-CM | POA: Diagnosis not present

## 2024-05-01 DIAGNOSIS — I482 Chronic atrial fibrillation, unspecified: Secondary | ICD-10-CM | POA: Diagnosis not present

## 2024-05-01 DIAGNOSIS — J432 Centrilobular emphysema: Secondary | ICD-10-CM | POA: Diagnosis not present

## 2024-05-01 DIAGNOSIS — S42212D Unspecified displaced fracture of surgical neck of left humerus, subsequent encounter for fracture with routine healing: Secondary | ICD-10-CM | POA: Diagnosis not present

## 2024-05-01 DIAGNOSIS — S82042D Displaced comminuted fracture of left patella, subsequent encounter for closed fracture with routine healing: Secondary | ICD-10-CM | POA: Diagnosis not present

## 2024-05-01 DIAGNOSIS — R3915 Urgency of urination: Secondary | ICD-10-CM | POA: Diagnosis not present

## 2024-05-01 DIAGNOSIS — R1310 Dysphagia, unspecified: Secondary | ICD-10-CM | POA: Diagnosis not present

## 2024-05-01 DIAGNOSIS — E119 Type 2 diabetes mellitus without complications: Secondary | ICD-10-CM | POA: Diagnosis not present

## 2024-05-01 DIAGNOSIS — S51802D Unspecified open wound of left forearm, subsequent encounter: Secondary | ICD-10-CM | POA: Diagnosis not present

## 2024-05-01 DIAGNOSIS — R338 Other retention of urine: Secondary | ICD-10-CM | POA: Diagnosis not present

## 2024-05-01 DIAGNOSIS — G250 Essential tremor: Secondary | ICD-10-CM | POA: Diagnosis not present

## 2024-05-01 DIAGNOSIS — M47812 Spondylosis without myelopathy or radiculopathy, cervical region: Secondary | ICD-10-CM | POA: Diagnosis not present

## 2024-05-01 DIAGNOSIS — E7800 Pure hypercholesterolemia, unspecified: Secondary | ICD-10-CM | POA: Diagnosis not present

## 2024-05-02 DIAGNOSIS — R600 Localized edema: Secondary | ICD-10-CM | POA: Diagnosis not present

## 2024-05-02 DIAGNOSIS — M6281 Muscle weakness (generalized): Secondary | ICD-10-CM | POA: Diagnosis not present

## 2024-05-02 DIAGNOSIS — F0390 Unspecified dementia without behavioral disturbance: Secondary | ICD-10-CM | POA: Diagnosis not present

## 2024-05-06 DIAGNOSIS — Z8781 Personal history of (healed) traumatic fracture: Secondary | ICD-10-CM | POA: Diagnosis not present

## 2024-05-06 DIAGNOSIS — R600 Localized edema: Secondary | ICD-10-CM | POA: Diagnosis not present

## 2024-05-06 DIAGNOSIS — I70213 Atherosclerosis of native arteries of extremities with intermittent claudication, bilateral legs: Secondary | ICD-10-CM | POA: Diagnosis not present

## 2024-05-06 DIAGNOSIS — Z9181 History of falling: Secondary | ICD-10-CM | POA: Diagnosis not present

## 2024-05-07 DIAGNOSIS — S42212D Unspecified displaced fracture of surgical neck of left humerus, subsequent encounter for fracture with routine healing: Secondary | ICD-10-CM | POA: Diagnosis not present

## 2024-05-07 DIAGNOSIS — R911 Solitary pulmonary nodule: Secondary | ICD-10-CM | POA: Diagnosis not present

## 2024-05-07 DIAGNOSIS — E119 Type 2 diabetes mellitus without complications: Secondary | ICD-10-CM | POA: Diagnosis not present

## 2024-05-07 DIAGNOSIS — I482 Chronic atrial fibrillation, unspecified: Secondary | ICD-10-CM | POA: Diagnosis not present

## 2024-05-07 DIAGNOSIS — G250 Essential tremor: Secondary | ICD-10-CM | POA: Diagnosis not present

## 2024-05-07 DIAGNOSIS — S82042D Displaced comminuted fracture of left patella, subsequent encounter for closed fracture with routine healing: Secondary | ICD-10-CM | POA: Diagnosis not present

## 2024-05-07 DIAGNOSIS — R3914 Feeling of incomplete bladder emptying: Secondary | ICD-10-CM | POA: Diagnosis not present

## 2024-05-07 DIAGNOSIS — R350 Frequency of micturition: Secondary | ICD-10-CM | POA: Diagnosis not present

## 2024-05-07 DIAGNOSIS — M47814 Spondylosis without myelopathy or radiculopathy, thoracic region: Secondary | ICD-10-CM | POA: Diagnosis not present

## 2024-05-07 DIAGNOSIS — R338 Other retention of urine: Secondary | ICD-10-CM | POA: Diagnosis not present

## 2024-05-07 DIAGNOSIS — E7800 Pure hypercholesterolemia, unspecified: Secondary | ICD-10-CM | POA: Diagnosis not present

## 2024-05-07 DIAGNOSIS — R1310 Dysphagia, unspecified: Secondary | ICD-10-CM | POA: Diagnosis not present

## 2024-05-07 DIAGNOSIS — M19012 Primary osteoarthritis, left shoulder: Secondary | ICD-10-CM | POA: Diagnosis not present

## 2024-05-07 DIAGNOSIS — S51802D Unspecified open wound of left forearm, subsequent encounter: Secondary | ICD-10-CM | POA: Diagnosis not present

## 2024-05-07 DIAGNOSIS — J4489 Other specified chronic obstructive pulmonary disease: Secondary | ICD-10-CM | POA: Diagnosis not present

## 2024-05-07 DIAGNOSIS — J432 Centrilobular emphysema: Secondary | ICD-10-CM | POA: Diagnosis not present

## 2024-05-07 DIAGNOSIS — N183 Chronic kidney disease, stage 3 unspecified: Secondary | ICD-10-CM | POA: Diagnosis not present

## 2024-05-07 DIAGNOSIS — M47812 Spondylosis without myelopathy or radiculopathy, cervical region: Secondary | ICD-10-CM | POA: Diagnosis not present

## 2024-05-07 DIAGNOSIS — R3915 Urgency of urination: Secondary | ICD-10-CM | POA: Diagnosis not present

## 2024-05-07 DIAGNOSIS — I129 Hypertensive chronic kidney disease with stage 1 through stage 4 chronic kidney disease, or unspecified chronic kidney disease: Secondary | ICD-10-CM | POA: Diagnosis not present

## 2024-05-09 DIAGNOSIS — R1310 Dysphagia, unspecified: Secondary | ICD-10-CM | POA: Diagnosis not present

## 2024-05-09 DIAGNOSIS — S82042D Displaced comminuted fracture of left patella, subsequent encounter for closed fracture with routine healing: Secondary | ICD-10-CM | POA: Diagnosis not present

## 2024-05-09 DIAGNOSIS — G250 Essential tremor: Secondary | ICD-10-CM | POA: Diagnosis not present

## 2024-05-09 DIAGNOSIS — I482 Chronic atrial fibrillation, unspecified: Secondary | ICD-10-CM | POA: Diagnosis not present

## 2024-05-09 DIAGNOSIS — R3915 Urgency of urination: Secondary | ICD-10-CM | POA: Diagnosis not present

## 2024-05-09 DIAGNOSIS — M47812 Spondylosis without myelopathy or radiculopathy, cervical region: Secondary | ICD-10-CM | POA: Diagnosis not present

## 2024-05-09 DIAGNOSIS — E7800 Pure hypercholesterolemia, unspecified: Secondary | ICD-10-CM | POA: Diagnosis not present

## 2024-05-09 DIAGNOSIS — J432 Centrilobular emphysema: Secondary | ICD-10-CM | POA: Diagnosis not present

## 2024-05-09 DIAGNOSIS — R350 Frequency of micturition: Secondary | ICD-10-CM | POA: Diagnosis not present

## 2024-05-09 DIAGNOSIS — E119 Type 2 diabetes mellitus without complications: Secondary | ICD-10-CM | POA: Diagnosis not present

## 2024-05-09 DIAGNOSIS — R338 Other retention of urine: Secondary | ICD-10-CM | POA: Diagnosis not present

## 2024-05-09 DIAGNOSIS — N183 Chronic kidney disease, stage 3 unspecified: Secondary | ICD-10-CM | POA: Diagnosis not present

## 2024-05-09 DIAGNOSIS — R911 Solitary pulmonary nodule: Secondary | ICD-10-CM | POA: Diagnosis not present

## 2024-05-09 DIAGNOSIS — M47814 Spondylosis without myelopathy or radiculopathy, thoracic region: Secondary | ICD-10-CM | POA: Diagnosis not present

## 2024-05-09 DIAGNOSIS — S42212D Unspecified displaced fracture of surgical neck of left humerus, subsequent encounter for fracture with routine healing: Secondary | ICD-10-CM | POA: Diagnosis not present

## 2024-05-09 DIAGNOSIS — I129 Hypertensive chronic kidney disease with stage 1 through stage 4 chronic kidney disease, or unspecified chronic kidney disease: Secondary | ICD-10-CM | POA: Diagnosis not present

## 2024-05-09 DIAGNOSIS — J4489 Other specified chronic obstructive pulmonary disease: Secondary | ICD-10-CM | POA: Diagnosis not present

## 2024-05-09 DIAGNOSIS — S51802D Unspecified open wound of left forearm, subsequent encounter: Secondary | ICD-10-CM | POA: Diagnosis not present

## 2024-05-09 DIAGNOSIS — M19012 Primary osteoarthritis, left shoulder: Secondary | ICD-10-CM | POA: Diagnosis not present

## 2024-05-09 DIAGNOSIS — R3914 Feeling of incomplete bladder emptying: Secondary | ICD-10-CM | POA: Diagnosis not present

## 2024-05-13 DIAGNOSIS — S82002A Unspecified fracture of left patella, initial encounter for closed fracture: Secondary | ICD-10-CM | POA: Diagnosis not present

## 2024-05-13 DIAGNOSIS — S42202A Unspecified fracture of upper end of left humerus, initial encounter for closed fracture: Secondary | ICD-10-CM | POA: Diagnosis not present

## 2024-05-14 DIAGNOSIS — E119 Type 2 diabetes mellitus without complications: Secondary | ICD-10-CM | POA: Diagnosis not present

## 2024-05-14 DIAGNOSIS — G250 Essential tremor: Secondary | ICD-10-CM | POA: Diagnosis not present

## 2024-05-14 DIAGNOSIS — R350 Frequency of micturition: Secondary | ICD-10-CM | POA: Diagnosis not present

## 2024-05-14 DIAGNOSIS — J432 Centrilobular emphysema: Secondary | ICD-10-CM | POA: Diagnosis not present

## 2024-05-14 DIAGNOSIS — E7800 Pure hypercholesterolemia, unspecified: Secondary | ICD-10-CM | POA: Diagnosis not present

## 2024-05-14 DIAGNOSIS — R3914 Feeling of incomplete bladder emptying: Secondary | ICD-10-CM | POA: Diagnosis not present

## 2024-05-14 DIAGNOSIS — I129 Hypertensive chronic kidney disease with stage 1 through stage 4 chronic kidney disease, or unspecified chronic kidney disease: Secondary | ICD-10-CM | POA: Diagnosis not present

## 2024-05-14 DIAGNOSIS — S82042D Displaced comminuted fracture of left patella, subsequent encounter for closed fracture with routine healing: Secondary | ICD-10-CM | POA: Diagnosis not present

## 2024-05-14 DIAGNOSIS — M47814 Spondylosis without myelopathy or radiculopathy, thoracic region: Secondary | ICD-10-CM | POA: Diagnosis not present

## 2024-05-14 DIAGNOSIS — R3915 Urgency of urination: Secondary | ICD-10-CM | POA: Diagnosis not present

## 2024-05-14 DIAGNOSIS — M19012 Primary osteoarthritis, left shoulder: Secondary | ICD-10-CM | POA: Diagnosis not present

## 2024-05-14 DIAGNOSIS — R1310 Dysphagia, unspecified: Secondary | ICD-10-CM | POA: Diagnosis not present

## 2024-05-14 DIAGNOSIS — S42212D Unspecified displaced fracture of surgical neck of left humerus, subsequent encounter for fracture with routine healing: Secondary | ICD-10-CM | POA: Diagnosis not present

## 2024-05-14 DIAGNOSIS — N183 Chronic kidney disease, stage 3 unspecified: Secondary | ICD-10-CM | POA: Diagnosis not present

## 2024-05-14 DIAGNOSIS — R911 Solitary pulmonary nodule: Secondary | ICD-10-CM | POA: Diagnosis not present

## 2024-05-14 DIAGNOSIS — R338 Other retention of urine: Secondary | ICD-10-CM | POA: Diagnosis not present

## 2024-05-14 DIAGNOSIS — S51802D Unspecified open wound of left forearm, subsequent encounter: Secondary | ICD-10-CM | POA: Diagnosis not present

## 2024-05-14 DIAGNOSIS — M47812 Spondylosis without myelopathy or radiculopathy, cervical region: Secondary | ICD-10-CM | POA: Diagnosis not present

## 2024-05-14 DIAGNOSIS — J4489 Other specified chronic obstructive pulmonary disease: Secondary | ICD-10-CM | POA: Diagnosis not present

## 2024-05-14 DIAGNOSIS — I482 Chronic atrial fibrillation, unspecified: Secondary | ICD-10-CM | POA: Diagnosis not present

## 2024-05-17 DIAGNOSIS — R3915 Urgency of urination: Secondary | ICD-10-CM | POA: Diagnosis not present

## 2024-05-17 DIAGNOSIS — R3914 Feeling of incomplete bladder emptying: Secondary | ICD-10-CM | POA: Diagnosis not present

## 2024-05-17 DIAGNOSIS — G250 Essential tremor: Secondary | ICD-10-CM | POA: Diagnosis not present

## 2024-05-17 DIAGNOSIS — I482 Chronic atrial fibrillation, unspecified: Secondary | ICD-10-CM | POA: Diagnosis not present

## 2024-05-17 DIAGNOSIS — S42212D Unspecified displaced fracture of surgical neck of left humerus, subsequent encounter for fracture with routine healing: Secondary | ICD-10-CM | POA: Diagnosis not present

## 2024-05-17 DIAGNOSIS — E119 Type 2 diabetes mellitus without complications: Secondary | ICD-10-CM | POA: Diagnosis not present

## 2024-05-17 DIAGNOSIS — R350 Frequency of micturition: Secondary | ICD-10-CM | POA: Diagnosis not present

## 2024-05-17 DIAGNOSIS — R338 Other retention of urine: Secondary | ICD-10-CM | POA: Diagnosis not present

## 2024-05-17 DIAGNOSIS — J432 Centrilobular emphysema: Secondary | ICD-10-CM | POA: Diagnosis not present

## 2024-05-17 DIAGNOSIS — M47814 Spondylosis without myelopathy or radiculopathy, thoracic region: Secondary | ICD-10-CM | POA: Diagnosis not present

## 2024-05-17 DIAGNOSIS — E7800 Pure hypercholesterolemia, unspecified: Secondary | ICD-10-CM | POA: Diagnosis not present

## 2024-05-17 DIAGNOSIS — N183 Chronic kidney disease, stage 3 unspecified: Secondary | ICD-10-CM | POA: Diagnosis not present

## 2024-05-17 DIAGNOSIS — S51802D Unspecified open wound of left forearm, subsequent encounter: Secondary | ICD-10-CM | POA: Diagnosis not present

## 2024-05-17 DIAGNOSIS — R1310 Dysphagia, unspecified: Secondary | ICD-10-CM | POA: Diagnosis not present

## 2024-05-17 DIAGNOSIS — R911 Solitary pulmonary nodule: Secondary | ICD-10-CM | POA: Diagnosis not present

## 2024-05-17 DIAGNOSIS — M19012 Primary osteoarthritis, left shoulder: Secondary | ICD-10-CM | POA: Diagnosis not present

## 2024-05-17 DIAGNOSIS — I129 Hypertensive chronic kidney disease with stage 1 through stage 4 chronic kidney disease, or unspecified chronic kidney disease: Secondary | ICD-10-CM | POA: Diagnosis not present

## 2024-05-17 DIAGNOSIS — J4489 Other specified chronic obstructive pulmonary disease: Secondary | ICD-10-CM | POA: Diagnosis not present

## 2024-05-17 DIAGNOSIS — S82042D Displaced comminuted fracture of left patella, subsequent encounter for closed fracture with routine healing: Secondary | ICD-10-CM | POA: Diagnosis not present

## 2024-05-17 DIAGNOSIS — M47812 Spondylosis without myelopathy or radiculopathy, cervical region: Secondary | ICD-10-CM | POA: Diagnosis not present

## 2024-05-23 DIAGNOSIS — R3914 Feeling of incomplete bladder emptying: Secondary | ICD-10-CM | POA: Diagnosis not present

## 2024-05-23 DIAGNOSIS — I482 Chronic atrial fibrillation, unspecified: Secondary | ICD-10-CM | POA: Diagnosis not present

## 2024-05-23 DIAGNOSIS — J432 Centrilobular emphysema: Secondary | ICD-10-CM | POA: Diagnosis not present

## 2024-05-23 DIAGNOSIS — J4489 Other specified chronic obstructive pulmonary disease: Secondary | ICD-10-CM | POA: Diagnosis not present

## 2024-05-23 DIAGNOSIS — N183 Chronic kidney disease, stage 3 unspecified: Secondary | ICD-10-CM | POA: Diagnosis not present

## 2024-05-23 DIAGNOSIS — R350 Frequency of micturition: Secondary | ICD-10-CM | POA: Diagnosis not present

## 2024-05-23 DIAGNOSIS — E7800 Pure hypercholesterolemia, unspecified: Secondary | ICD-10-CM | POA: Diagnosis not present

## 2024-05-23 DIAGNOSIS — S82042D Displaced comminuted fracture of left patella, subsequent encounter for closed fracture with routine healing: Secondary | ICD-10-CM | POA: Diagnosis not present

## 2024-05-23 DIAGNOSIS — M47814 Spondylosis without myelopathy or radiculopathy, thoracic region: Secondary | ICD-10-CM | POA: Diagnosis not present

## 2024-05-23 DIAGNOSIS — M47812 Spondylosis without myelopathy or radiculopathy, cervical region: Secondary | ICD-10-CM | POA: Diagnosis not present

## 2024-05-23 DIAGNOSIS — S42212D Unspecified displaced fracture of surgical neck of left humerus, subsequent encounter for fracture with routine healing: Secondary | ICD-10-CM | POA: Diagnosis not present

## 2024-05-23 DIAGNOSIS — G250 Essential tremor: Secondary | ICD-10-CM | POA: Diagnosis not present

## 2024-05-23 DIAGNOSIS — E119 Type 2 diabetes mellitus without complications: Secondary | ICD-10-CM | POA: Diagnosis not present

## 2024-05-23 DIAGNOSIS — I129 Hypertensive chronic kidney disease with stage 1 through stage 4 chronic kidney disease, or unspecified chronic kidney disease: Secondary | ICD-10-CM | POA: Diagnosis not present

## 2024-05-23 DIAGNOSIS — R3915 Urgency of urination: Secondary | ICD-10-CM | POA: Diagnosis not present

## 2024-05-23 DIAGNOSIS — R1310 Dysphagia, unspecified: Secondary | ICD-10-CM | POA: Diagnosis not present

## 2024-05-23 DIAGNOSIS — R911 Solitary pulmonary nodule: Secondary | ICD-10-CM | POA: Diagnosis not present

## 2024-05-23 DIAGNOSIS — R338 Other retention of urine: Secondary | ICD-10-CM | POA: Diagnosis not present

## 2024-05-23 DIAGNOSIS — M19012 Primary osteoarthritis, left shoulder: Secondary | ICD-10-CM | POA: Diagnosis not present

## 2024-05-23 DIAGNOSIS — S51802D Unspecified open wound of left forearm, subsequent encounter: Secondary | ICD-10-CM | POA: Diagnosis not present

## 2024-05-30 DIAGNOSIS — J432 Centrilobular emphysema: Secondary | ICD-10-CM | POA: Diagnosis not present

## 2024-05-30 DIAGNOSIS — R3914 Feeling of incomplete bladder emptying: Secondary | ICD-10-CM | POA: Diagnosis not present

## 2024-05-30 DIAGNOSIS — M47812 Spondylosis without myelopathy or radiculopathy, cervical region: Secondary | ICD-10-CM | POA: Diagnosis not present

## 2024-05-30 DIAGNOSIS — R1310 Dysphagia, unspecified: Secondary | ICD-10-CM | POA: Diagnosis not present

## 2024-05-30 DIAGNOSIS — M6281 Muscle weakness (generalized): Secondary | ICD-10-CM | POA: Diagnosis not present

## 2024-05-30 DIAGNOSIS — G250 Essential tremor: Secondary | ICD-10-CM | POA: Diagnosis not present

## 2024-05-30 DIAGNOSIS — I482 Chronic atrial fibrillation, unspecified: Secondary | ICD-10-CM | POA: Diagnosis not present

## 2024-05-30 DIAGNOSIS — R600 Localized edema: Secondary | ICD-10-CM | POA: Diagnosis not present

## 2024-05-30 DIAGNOSIS — N183 Chronic kidney disease, stage 3 unspecified: Secondary | ICD-10-CM | POA: Diagnosis not present

## 2024-05-30 DIAGNOSIS — R338 Other retention of urine: Secondary | ICD-10-CM | POA: Diagnosis not present

## 2024-05-30 DIAGNOSIS — M47814 Spondylosis without myelopathy or radiculopathy, thoracic region: Secondary | ICD-10-CM | POA: Diagnosis not present

## 2024-05-30 DIAGNOSIS — R3915 Urgency of urination: Secondary | ICD-10-CM | POA: Diagnosis not present

## 2024-05-30 DIAGNOSIS — S42212D Unspecified displaced fracture of surgical neck of left humerus, subsequent encounter for fracture with routine healing: Secondary | ICD-10-CM | POA: Diagnosis not present

## 2024-05-30 DIAGNOSIS — S82042D Displaced comminuted fracture of left patella, subsequent encounter for closed fracture with routine healing: Secondary | ICD-10-CM | POA: Diagnosis not present

## 2024-05-30 DIAGNOSIS — M19012 Primary osteoarthritis, left shoulder: Secondary | ICD-10-CM | POA: Diagnosis not present

## 2024-05-30 DIAGNOSIS — S51802D Unspecified open wound of left forearm, subsequent encounter: Secondary | ICD-10-CM | POA: Diagnosis not present

## 2024-05-30 DIAGNOSIS — R350 Frequency of micturition: Secondary | ICD-10-CM | POA: Diagnosis not present

## 2024-05-30 DIAGNOSIS — E7800 Pure hypercholesterolemia, unspecified: Secondary | ICD-10-CM | POA: Diagnosis not present

## 2024-05-30 DIAGNOSIS — R911 Solitary pulmonary nodule: Secondary | ICD-10-CM | POA: Diagnosis not present

## 2024-05-30 DIAGNOSIS — I129 Hypertensive chronic kidney disease with stage 1 through stage 4 chronic kidney disease, or unspecified chronic kidney disease: Secondary | ICD-10-CM | POA: Diagnosis not present

## 2024-05-30 DIAGNOSIS — E119 Type 2 diabetes mellitus without complications: Secondary | ICD-10-CM | POA: Diagnosis not present

## 2024-05-30 DIAGNOSIS — F0390 Unspecified dementia without behavioral disturbance: Secondary | ICD-10-CM | POA: Diagnosis not present

## 2024-05-30 DIAGNOSIS — J4489 Other specified chronic obstructive pulmonary disease: Secondary | ICD-10-CM | POA: Diagnosis not present

## 2024-05-31 DIAGNOSIS — R519 Headache, unspecified: Secondary | ICD-10-CM | POA: Diagnosis not present

## 2024-05-31 DIAGNOSIS — S81812A Laceration without foreign body, left lower leg, initial encounter: Secondary | ICD-10-CM | POA: Diagnosis not present

## 2024-05-31 DIAGNOSIS — S0990XA Unspecified injury of head, initial encounter: Secondary | ICD-10-CM | POA: Diagnosis not present

## 2024-05-31 DIAGNOSIS — S41112A Laceration without foreign body of left upper arm, initial encounter: Secondary | ICD-10-CM | POA: Diagnosis not present

## 2024-05-31 DIAGNOSIS — R58 Hemorrhage, not elsewhere classified: Secondary | ICD-10-CM | POA: Diagnosis not present

## 2024-05-31 DIAGNOSIS — M25512 Pain in left shoulder: Secondary | ICD-10-CM | POA: Diagnosis not present

## 2024-05-31 DIAGNOSIS — S098XXA Other specified injuries of head, initial encounter: Secondary | ICD-10-CM | POA: Diagnosis not present

## 2024-05-31 DIAGNOSIS — W19XXXA Unspecified fall, initial encounter: Secondary | ICD-10-CM | POA: Diagnosis not present

## 2024-05-31 DIAGNOSIS — R6889 Other general symptoms and signs: Secondary | ICD-10-CM | POA: Diagnosis not present

## 2024-05-31 DIAGNOSIS — Z743 Need for continuous supervision: Secondary | ICD-10-CM | POA: Diagnosis not present

## 2024-05-31 DIAGNOSIS — Z043 Encounter for examination and observation following other accident: Secondary | ICD-10-CM | POA: Diagnosis not present

## 2024-05-31 DIAGNOSIS — I4891 Unspecified atrial fibrillation: Secondary | ICD-10-CM | POA: Diagnosis not present

## 2024-05-31 DIAGNOSIS — W010XXA Fall on same level from slipping, tripping and stumbling without subsequent striking against object, initial encounter: Secondary | ICD-10-CM | POA: Diagnosis not present

## 2024-06-05 DIAGNOSIS — E119 Type 2 diabetes mellitus without complications: Secondary | ICD-10-CM | POA: Diagnosis not present

## 2024-06-05 DIAGNOSIS — S51802D Unspecified open wound of left forearm, subsequent encounter: Secondary | ICD-10-CM | POA: Diagnosis not present

## 2024-06-05 DIAGNOSIS — I129 Hypertensive chronic kidney disease with stage 1 through stage 4 chronic kidney disease, or unspecified chronic kidney disease: Secondary | ICD-10-CM | POA: Diagnosis not present

## 2024-06-05 DIAGNOSIS — R350 Frequency of micturition: Secondary | ICD-10-CM | POA: Diagnosis not present

## 2024-06-05 DIAGNOSIS — R3914 Feeling of incomplete bladder emptying: Secondary | ICD-10-CM | POA: Diagnosis not present

## 2024-06-05 DIAGNOSIS — E7800 Pure hypercholesterolemia, unspecified: Secondary | ICD-10-CM | POA: Diagnosis not present

## 2024-06-05 DIAGNOSIS — R338 Other retention of urine: Secondary | ICD-10-CM | POA: Diagnosis not present

## 2024-06-05 DIAGNOSIS — N183 Chronic kidney disease, stage 3 unspecified: Secondary | ICD-10-CM | POA: Diagnosis not present

## 2024-06-05 DIAGNOSIS — I482 Chronic atrial fibrillation, unspecified: Secondary | ICD-10-CM | POA: Diagnosis not present

## 2024-06-05 DIAGNOSIS — R3915 Urgency of urination: Secondary | ICD-10-CM | POA: Diagnosis not present

## 2024-06-05 DIAGNOSIS — M19012 Primary osteoarthritis, left shoulder: Secondary | ICD-10-CM | POA: Diagnosis not present

## 2024-06-05 DIAGNOSIS — M47812 Spondylosis without myelopathy or radiculopathy, cervical region: Secondary | ICD-10-CM | POA: Diagnosis not present

## 2024-06-05 DIAGNOSIS — M47814 Spondylosis without myelopathy or radiculopathy, thoracic region: Secondary | ICD-10-CM | POA: Diagnosis not present

## 2024-06-05 DIAGNOSIS — R911 Solitary pulmonary nodule: Secondary | ICD-10-CM | POA: Diagnosis not present

## 2024-06-05 DIAGNOSIS — G250 Essential tremor: Secondary | ICD-10-CM | POA: Diagnosis not present

## 2024-06-05 DIAGNOSIS — R1310 Dysphagia, unspecified: Secondary | ICD-10-CM | POA: Diagnosis not present

## 2024-06-05 DIAGNOSIS — J432 Centrilobular emphysema: Secondary | ICD-10-CM | POA: Diagnosis not present

## 2024-06-05 DIAGNOSIS — S82042D Displaced comminuted fracture of left patella, subsequent encounter for closed fracture with routine healing: Secondary | ICD-10-CM | POA: Diagnosis not present

## 2024-06-05 DIAGNOSIS — S42212D Unspecified displaced fracture of surgical neck of left humerus, subsequent encounter for fracture with routine healing: Secondary | ICD-10-CM | POA: Diagnosis not present

## 2024-06-05 DIAGNOSIS — J4489 Other specified chronic obstructive pulmonary disease: Secondary | ICD-10-CM | POA: Diagnosis not present

## 2024-06-06 DIAGNOSIS — S60222A Contusion of left hand, initial encounter: Secondary | ICD-10-CM | POA: Diagnosis not present

## 2024-06-06 DIAGNOSIS — F0390 Unspecified dementia without behavioral disturbance: Secondary | ICD-10-CM | POA: Diagnosis not present

## 2024-06-06 DIAGNOSIS — R2689 Other abnormalities of gait and mobility: Secondary | ICD-10-CM | POA: Diagnosis not present

## 2024-06-06 DIAGNOSIS — S0083XA Contusion of other part of head, initial encounter: Secondary | ICD-10-CM | POA: Diagnosis not present

## 2024-06-06 DIAGNOSIS — M6281 Muscle weakness (generalized): Secondary | ICD-10-CM | POA: Diagnosis not present

## 2024-06-06 DIAGNOSIS — Z9181 History of falling: Secondary | ICD-10-CM | POA: Diagnosis not present

## 2024-06-15 DIAGNOSIS — J4489 Other specified chronic obstructive pulmonary disease: Secondary | ICD-10-CM | POA: Diagnosis not present

## 2024-06-15 DIAGNOSIS — R338 Other retention of urine: Secondary | ICD-10-CM | POA: Diagnosis not present

## 2024-06-15 DIAGNOSIS — R1310 Dysphagia, unspecified: Secondary | ICD-10-CM | POA: Diagnosis not present

## 2024-06-15 DIAGNOSIS — N183 Chronic kidney disease, stage 3 unspecified: Secondary | ICD-10-CM | POA: Diagnosis not present

## 2024-06-15 DIAGNOSIS — R3915 Urgency of urination: Secondary | ICD-10-CM | POA: Diagnosis not present

## 2024-06-15 DIAGNOSIS — S51802D Unspecified open wound of left forearm, subsequent encounter: Secondary | ICD-10-CM | POA: Diagnosis not present

## 2024-06-15 DIAGNOSIS — M47814 Spondylosis without myelopathy or radiculopathy, thoracic region: Secondary | ICD-10-CM | POA: Diagnosis not present

## 2024-06-15 DIAGNOSIS — I129 Hypertensive chronic kidney disease with stage 1 through stage 4 chronic kidney disease, or unspecified chronic kidney disease: Secondary | ICD-10-CM | POA: Diagnosis not present

## 2024-06-15 DIAGNOSIS — M19012 Primary osteoarthritis, left shoulder: Secondary | ICD-10-CM | POA: Diagnosis not present

## 2024-06-15 DIAGNOSIS — R3914 Feeling of incomplete bladder emptying: Secondary | ICD-10-CM | POA: Diagnosis not present

## 2024-06-15 DIAGNOSIS — S82042D Displaced comminuted fracture of left patella, subsequent encounter for closed fracture with routine healing: Secondary | ICD-10-CM | POA: Diagnosis not present

## 2024-06-15 DIAGNOSIS — R350 Frequency of micturition: Secondary | ICD-10-CM | POA: Diagnosis not present

## 2024-06-15 DIAGNOSIS — G250 Essential tremor: Secondary | ICD-10-CM | POA: Diagnosis not present

## 2024-06-15 DIAGNOSIS — E119 Type 2 diabetes mellitus without complications: Secondary | ICD-10-CM | POA: Diagnosis not present

## 2024-06-15 DIAGNOSIS — J432 Centrilobular emphysema: Secondary | ICD-10-CM | POA: Diagnosis not present

## 2024-06-15 DIAGNOSIS — M47812 Spondylosis without myelopathy or radiculopathy, cervical region: Secondary | ICD-10-CM | POA: Diagnosis not present

## 2024-06-15 DIAGNOSIS — S42212D Unspecified displaced fracture of surgical neck of left humerus, subsequent encounter for fracture with routine healing: Secondary | ICD-10-CM | POA: Diagnosis not present

## 2024-06-15 DIAGNOSIS — I482 Chronic atrial fibrillation, unspecified: Secondary | ICD-10-CM | POA: Diagnosis not present

## 2024-06-15 DIAGNOSIS — R911 Solitary pulmonary nodule: Secondary | ICD-10-CM | POA: Diagnosis not present

## 2024-06-15 DIAGNOSIS — E7800 Pure hypercholesterolemia, unspecified: Secondary | ICD-10-CM | POA: Diagnosis not present

## 2024-06-16 DIAGNOSIS — E1122 Type 2 diabetes mellitus with diabetic chronic kidney disease: Secondary | ICD-10-CM | POA: Diagnosis not present

## 2024-06-16 DIAGNOSIS — N183 Chronic kidney disease, stage 3 unspecified: Secondary | ICD-10-CM | POA: Diagnosis not present

## 2024-06-16 DIAGNOSIS — I129 Hypertensive chronic kidney disease with stage 1 through stage 4 chronic kidney disease, or unspecified chronic kidney disease: Secondary | ICD-10-CM | POA: Diagnosis not present

## 2024-06-16 DIAGNOSIS — S42212D Unspecified displaced fracture of surgical neck of left humerus, subsequent encounter for fracture with routine healing: Secondary | ICD-10-CM | POA: Diagnosis not present

## 2024-06-27 DIAGNOSIS — R338 Other retention of urine: Secondary | ICD-10-CM | POA: Diagnosis not present

## 2024-06-27 DIAGNOSIS — M47814 Spondylosis without myelopathy or radiculopathy, thoracic region: Secondary | ICD-10-CM | POA: Diagnosis not present

## 2024-06-27 DIAGNOSIS — E7800 Pure hypercholesterolemia, unspecified: Secondary | ICD-10-CM | POA: Diagnosis not present

## 2024-06-27 DIAGNOSIS — R1310 Dysphagia, unspecified: Secondary | ICD-10-CM | POA: Diagnosis not present

## 2024-06-27 DIAGNOSIS — E559 Vitamin D deficiency, unspecified: Secondary | ICD-10-CM | POA: Diagnosis not present

## 2024-06-27 DIAGNOSIS — I482 Chronic atrial fibrillation, unspecified: Secondary | ICD-10-CM | POA: Diagnosis not present

## 2024-06-27 DIAGNOSIS — M47812 Spondylosis without myelopathy or radiculopathy, cervical region: Secondary | ICD-10-CM | POA: Diagnosis not present

## 2024-06-27 DIAGNOSIS — J432 Centrilobular emphysema: Secondary | ICD-10-CM | POA: Diagnosis not present

## 2024-06-27 DIAGNOSIS — S42212D Unspecified displaced fracture of surgical neck of left humerus, subsequent encounter for fracture with routine healing: Secondary | ICD-10-CM | POA: Diagnosis not present

## 2024-06-27 DIAGNOSIS — R350 Frequency of micturition: Secondary | ICD-10-CM | POA: Diagnosis not present

## 2024-06-27 DIAGNOSIS — G250 Essential tremor: Secondary | ICD-10-CM | POA: Diagnosis not present

## 2024-06-27 DIAGNOSIS — R3914 Feeling of incomplete bladder emptying: Secondary | ICD-10-CM | POA: Diagnosis not present

## 2024-06-27 DIAGNOSIS — M19012 Primary osteoarthritis, left shoulder: Secondary | ICD-10-CM | POA: Diagnosis not present

## 2024-06-27 DIAGNOSIS — I129 Hypertensive chronic kidney disease with stage 1 through stage 4 chronic kidney disease, or unspecified chronic kidney disease: Secondary | ICD-10-CM | POA: Diagnosis not present

## 2024-06-27 DIAGNOSIS — N183 Chronic kidney disease, stage 3 unspecified: Secondary | ICD-10-CM | POA: Diagnosis not present

## 2024-06-27 DIAGNOSIS — E1122 Type 2 diabetes mellitus with diabetic chronic kidney disease: Secondary | ICD-10-CM | POA: Diagnosis not present

## 2024-06-27 DIAGNOSIS — J4489 Other specified chronic obstructive pulmonary disease: Secondary | ICD-10-CM | POA: Diagnosis not present

## 2024-06-27 DIAGNOSIS — R911 Solitary pulmonary nodule: Secondary | ICD-10-CM | POA: Diagnosis not present

## 2024-06-27 DIAGNOSIS — R3915 Urgency of urination: Secondary | ICD-10-CM | POA: Diagnosis not present

## 2024-06-27 DIAGNOSIS — S82042D Displaced comminuted fracture of left patella, subsequent encounter for closed fracture with routine healing: Secondary | ICD-10-CM | POA: Diagnosis not present

## 2024-06-28 DIAGNOSIS — R262 Difficulty in walking, not elsewhere classified: Secondary | ICD-10-CM | POA: Diagnosis not present

## 2024-06-28 DIAGNOSIS — B351 Tinea unguium: Secondary | ICD-10-CM | POA: Diagnosis not present

## 2024-06-28 DIAGNOSIS — I739 Peripheral vascular disease, unspecified: Secondary | ICD-10-CM | POA: Diagnosis not present

## 2024-06-28 DIAGNOSIS — M6281 Muscle weakness (generalized): Secondary | ICD-10-CM | POA: Diagnosis not present

## 2024-06-28 DIAGNOSIS — E118 Type 2 diabetes mellitus with unspecified complications: Secondary | ICD-10-CM | POA: Diagnosis not present

## 2024-06-28 DIAGNOSIS — R238 Other skin changes: Secondary | ICD-10-CM | POA: Diagnosis not present

## 2024-06-28 DIAGNOSIS — I10 Essential (primary) hypertension: Secondary | ICD-10-CM | POA: Diagnosis not present

## 2024-06-28 DIAGNOSIS — I872 Venous insufficiency (chronic) (peripheral): Secondary | ICD-10-CM | POA: Diagnosis not present

## 2024-06-28 DIAGNOSIS — R601 Generalized edema: Secondary | ICD-10-CM | POA: Diagnosis not present

## 2024-06-28 DIAGNOSIS — L84 Corns and callosities: Secondary | ICD-10-CM | POA: Diagnosis not present

## 2024-07-03 DIAGNOSIS — I129 Hypertensive chronic kidney disease with stage 1 through stage 4 chronic kidney disease, or unspecified chronic kidney disease: Secondary | ICD-10-CM | POA: Diagnosis not present

## 2024-07-03 DIAGNOSIS — I482 Chronic atrial fibrillation, unspecified: Secondary | ICD-10-CM | POA: Diagnosis not present

## 2024-07-03 DIAGNOSIS — E1122 Type 2 diabetes mellitus with diabetic chronic kidney disease: Secondary | ICD-10-CM | POA: Diagnosis not present

## 2024-07-03 DIAGNOSIS — M47812 Spondylosis without myelopathy or radiculopathy, cervical region: Secondary | ICD-10-CM | POA: Diagnosis not present

## 2024-07-03 DIAGNOSIS — J432 Centrilobular emphysema: Secondary | ICD-10-CM | POA: Diagnosis not present

## 2024-07-03 DIAGNOSIS — R1310 Dysphagia, unspecified: Secondary | ICD-10-CM | POA: Diagnosis not present

## 2024-07-03 DIAGNOSIS — R3914 Feeling of incomplete bladder emptying: Secondary | ICD-10-CM | POA: Diagnosis not present

## 2024-07-03 DIAGNOSIS — R338 Other retention of urine: Secondary | ICD-10-CM | POA: Diagnosis not present

## 2024-07-03 DIAGNOSIS — S82042D Displaced comminuted fracture of left patella, subsequent encounter for closed fracture with routine healing: Secondary | ICD-10-CM | POA: Diagnosis not present

## 2024-07-03 DIAGNOSIS — R911 Solitary pulmonary nodule: Secondary | ICD-10-CM | POA: Diagnosis not present

## 2024-07-03 DIAGNOSIS — E7800 Pure hypercholesterolemia, unspecified: Secondary | ICD-10-CM | POA: Diagnosis not present

## 2024-07-03 DIAGNOSIS — M19012 Primary osteoarthritis, left shoulder: Secondary | ICD-10-CM | POA: Diagnosis not present

## 2024-07-03 DIAGNOSIS — M47814 Spondylosis without myelopathy or radiculopathy, thoracic region: Secondary | ICD-10-CM | POA: Diagnosis not present

## 2024-07-03 DIAGNOSIS — G250 Essential tremor: Secondary | ICD-10-CM | POA: Diagnosis not present

## 2024-07-03 DIAGNOSIS — J4489 Other specified chronic obstructive pulmonary disease: Secondary | ICD-10-CM | POA: Diagnosis not present

## 2024-07-03 DIAGNOSIS — S42212D Unspecified displaced fracture of surgical neck of left humerus, subsequent encounter for fracture with routine healing: Secondary | ICD-10-CM | POA: Diagnosis not present

## 2024-07-03 DIAGNOSIS — N183 Chronic kidney disease, stage 3 unspecified: Secondary | ICD-10-CM | POA: Diagnosis not present

## 2024-07-03 DIAGNOSIS — R3915 Urgency of urination: Secondary | ICD-10-CM | POA: Diagnosis not present

## 2024-07-03 DIAGNOSIS — E559 Vitamin D deficiency, unspecified: Secondary | ICD-10-CM | POA: Diagnosis not present

## 2024-07-03 DIAGNOSIS — R350 Frequency of micturition: Secondary | ICD-10-CM | POA: Diagnosis not present

## 2024-07-11 DIAGNOSIS — I739 Peripheral vascular disease, unspecified: Secondary | ICD-10-CM | POA: Diagnosis not present

## 2024-07-11 DIAGNOSIS — M47814 Spondylosis without myelopathy or radiculopathy, thoracic region: Secondary | ICD-10-CM | POA: Diagnosis not present

## 2024-07-11 DIAGNOSIS — J432 Centrilobular emphysema: Secondary | ICD-10-CM | POA: Diagnosis not present

## 2024-07-11 DIAGNOSIS — M19012 Primary osteoarthritis, left shoulder: Secondary | ICD-10-CM | POA: Diagnosis not present

## 2024-07-11 DIAGNOSIS — F0390 Unspecified dementia without behavioral disturbance: Secondary | ICD-10-CM | POA: Diagnosis not present

## 2024-07-11 DIAGNOSIS — M6281 Muscle weakness (generalized): Secondary | ICD-10-CM | POA: Diagnosis not present

## 2024-07-11 DIAGNOSIS — S42212D Unspecified displaced fracture of surgical neck of left humerus, subsequent encounter for fracture with routine healing: Secondary | ICD-10-CM | POA: Diagnosis not present

## 2024-07-11 DIAGNOSIS — R350 Frequency of micturition: Secondary | ICD-10-CM | POA: Diagnosis not present

## 2024-07-11 DIAGNOSIS — R911 Solitary pulmonary nodule: Secondary | ICD-10-CM | POA: Diagnosis not present

## 2024-07-11 DIAGNOSIS — E559 Vitamin D deficiency, unspecified: Secondary | ICD-10-CM | POA: Diagnosis not present

## 2024-07-11 DIAGNOSIS — R3915 Urgency of urination: Secondary | ICD-10-CM | POA: Diagnosis not present

## 2024-07-11 DIAGNOSIS — N183 Chronic kidney disease, stage 3 unspecified: Secondary | ICD-10-CM | POA: Diagnosis not present

## 2024-07-11 DIAGNOSIS — S82042D Displaced comminuted fracture of left patella, subsequent encounter for closed fracture with routine healing: Secondary | ICD-10-CM | POA: Diagnosis not present

## 2024-07-11 DIAGNOSIS — I129 Hypertensive chronic kidney disease with stage 1 through stage 4 chronic kidney disease, or unspecified chronic kidney disease: Secondary | ICD-10-CM | POA: Diagnosis not present

## 2024-07-11 DIAGNOSIS — G250 Essential tremor: Secondary | ICD-10-CM | POA: Diagnosis not present

## 2024-07-11 DIAGNOSIS — R338 Other retention of urine: Secondary | ICD-10-CM | POA: Diagnosis not present

## 2024-07-11 DIAGNOSIS — R3914 Feeling of incomplete bladder emptying: Secondary | ICD-10-CM | POA: Diagnosis not present

## 2024-07-11 DIAGNOSIS — M47812 Spondylosis without myelopathy or radiculopathy, cervical region: Secondary | ICD-10-CM | POA: Diagnosis not present

## 2024-07-11 DIAGNOSIS — R600 Localized edema: Secondary | ICD-10-CM | POA: Diagnosis not present

## 2024-07-11 DIAGNOSIS — E1122 Type 2 diabetes mellitus with diabetic chronic kidney disease: Secondary | ICD-10-CM | POA: Diagnosis not present

## 2024-07-11 DIAGNOSIS — R1310 Dysphagia, unspecified: Secondary | ICD-10-CM | POA: Diagnosis not present

## 2024-07-11 DIAGNOSIS — J4489 Other specified chronic obstructive pulmonary disease: Secondary | ICD-10-CM | POA: Diagnosis not present

## 2024-07-11 DIAGNOSIS — I482 Chronic atrial fibrillation, unspecified: Secondary | ICD-10-CM | POA: Diagnosis not present

## 2024-07-11 DIAGNOSIS — E7800 Pure hypercholesterolemia, unspecified: Secondary | ICD-10-CM | POA: Diagnosis not present

## 2024-07-13 DIAGNOSIS — G250 Essential tremor: Secondary | ICD-10-CM | POA: Diagnosis not present

## 2024-07-13 DIAGNOSIS — M47814 Spondylosis without myelopathy or radiculopathy, thoracic region: Secondary | ICD-10-CM | POA: Diagnosis not present

## 2024-07-13 DIAGNOSIS — I482 Chronic atrial fibrillation, unspecified: Secondary | ICD-10-CM | POA: Diagnosis not present

## 2024-07-13 DIAGNOSIS — E7800 Pure hypercholesterolemia, unspecified: Secondary | ICD-10-CM | POA: Diagnosis not present

## 2024-07-13 DIAGNOSIS — S82042D Displaced comminuted fracture of left patella, subsequent encounter for closed fracture with routine healing: Secondary | ICD-10-CM | POA: Diagnosis not present

## 2024-07-13 DIAGNOSIS — J432 Centrilobular emphysema: Secondary | ICD-10-CM | POA: Diagnosis not present

## 2024-07-13 DIAGNOSIS — J4489 Other specified chronic obstructive pulmonary disease: Secondary | ICD-10-CM | POA: Diagnosis not present

## 2024-07-13 DIAGNOSIS — N183 Chronic kidney disease, stage 3 unspecified: Secondary | ICD-10-CM | POA: Diagnosis not present

## 2024-07-13 DIAGNOSIS — S42212D Unspecified displaced fracture of surgical neck of left humerus, subsequent encounter for fracture with routine healing: Secondary | ICD-10-CM | POA: Diagnosis not present

## 2024-07-13 DIAGNOSIS — R1310 Dysphagia, unspecified: Secondary | ICD-10-CM | POA: Diagnosis not present

## 2024-07-13 DIAGNOSIS — M19012 Primary osteoarthritis, left shoulder: Secondary | ICD-10-CM | POA: Diagnosis not present

## 2024-07-13 DIAGNOSIS — R338 Other retention of urine: Secondary | ICD-10-CM | POA: Diagnosis not present

## 2024-07-13 DIAGNOSIS — I129 Hypertensive chronic kidney disease with stage 1 through stage 4 chronic kidney disease, or unspecified chronic kidney disease: Secondary | ICD-10-CM | POA: Diagnosis not present

## 2024-07-13 DIAGNOSIS — E559 Vitamin D deficiency, unspecified: Secondary | ICD-10-CM | POA: Diagnosis not present

## 2024-07-13 DIAGNOSIS — M47812 Spondylosis without myelopathy or radiculopathy, cervical region: Secondary | ICD-10-CM | POA: Diagnosis not present

## 2024-07-13 DIAGNOSIS — E1122 Type 2 diabetes mellitus with diabetic chronic kidney disease: Secondary | ICD-10-CM | POA: Diagnosis not present

## 2024-07-13 DIAGNOSIS — R3914 Feeling of incomplete bladder emptying: Secondary | ICD-10-CM | POA: Diagnosis not present

## 2024-07-13 DIAGNOSIS — R911 Solitary pulmonary nodule: Secondary | ICD-10-CM | POA: Diagnosis not present

## 2024-07-13 DIAGNOSIS — R3915 Urgency of urination: Secondary | ICD-10-CM | POA: Diagnosis not present

## 2024-07-13 DIAGNOSIS — R350 Frequency of micturition: Secondary | ICD-10-CM | POA: Diagnosis not present

## 2024-07-18 DIAGNOSIS — Z87440 Personal history of urinary (tract) infections: Secondary | ICD-10-CM | POA: Diagnosis not present

## 2024-07-18 DIAGNOSIS — F0390 Unspecified dementia without behavioral disturbance: Secondary | ICD-10-CM | POA: Diagnosis not present

## 2024-07-18 DIAGNOSIS — R5381 Other malaise: Secondary | ICD-10-CM | POA: Diagnosis not present

## 2024-08-15 DIAGNOSIS — U071 COVID-19: Secondary | ICD-10-CM

## 2024-08-15 DIAGNOSIS — R600 Localized edema: Secondary | ICD-10-CM

## 2024-08-15 DIAGNOSIS — I4891 Unspecified atrial fibrillation: Secondary | ICD-10-CM | POA: Diagnosis not present

## 2024-08-15 DIAGNOSIS — I129 Hypertensive chronic kidney disease with stage 1 through stage 4 chronic kidney disease, or unspecified chronic kidney disease: Secondary | ICD-10-CM | POA: Diagnosis not present

## 2024-08-15 DIAGNOSIS — I951 Orthostatic hypotension: Secondary | ICD-10-CM | POA: Diagnosis not present

## 2024-08-15 DIAGNOSIS — M6281 Muscle weakness (generalized): Secondary | ICD-10-CM

## 2024-08-15 DIAGNOSIS — N1831 Chronic kidney disease, stage 3a: Secondary | ICD-10-CM | POA: Diagnosis not present

## 2024-08-19 DIAGNOSIS — M1990 Unspecified osteoarthritis, unspecified site: Secondary | ICD-10-CM | POA: Diagnosis not present

## 2024-08-19 DIAGNOSIS — F0390 Unspecified dementia without behavioral disturbance: Secondary | ICD-10-CM

## 2024-08-19 DIAGNOSIS — N400 Enlarged prostate without lower urinary tract symptoms: Secondary | ICD-10-CM

## 2024-08-19 DIAGNOSIS — I129 Hypertensive chronic kidney disease with stage 1 through stage 4 chronic kidney disease, or unspecified chronic kidney disease: Secondary | ICD-10-CM | POA: Diagnosis not present

## 2024-08-19 DIAGNOSIS — K219 Gastro-esophageal reflux disease without esophagitis: Secondary | ICD-10-CM

## 2024-08-19 DIAGNOSIS — N183 Chronic kidney disease, stage 3 unspecified: Secondary | ICD-10-CM | POA: Diagnosis not present

## 2024-08-19 DIAGNOSIS — I4891 Unspecified atrial fibrillation: Secondary | ICD-10-CM | POA: Diagnosis not present

## 2024-08-19 DIAGNOSIS — G894 Chronic pain syndrome: Secondary | ICD-10-CM

## 2024-08-22 DIAGNOSIS — M6281 Muscle weakness (generalized): Secondary | ICD-10-CM | POA: Diagnosis not present

## 2024-08-22 DIAGNOSIS — R600 Localized edema: Secondary | ICD-10-CM | POA: Diagnosis not present

## 2024-08-22 DIAGNOSIS — I739 Peripheral vascular disease, unspecified: Secondary | ICD-10-CM | POA: Diagnosis not present

## 2024-08-22 DIAGNOSIS — F0390 Unspecified dementia without behavioral disturbance: Secondary | ICD-10-CM | POA: Diagnosis not present
